# Patient Record
Sex: Female | Born: 1980 | Race: Black or African American | Hispanic: No | Marital: Single | Smoking: Former smoker
Health system: Southern US, Community
[De-identification: ages and names within clinical notes are randomized; demographics above are authoritative.]

## PROBLEM LIST (undated history)

## (undated) ENCOUNTER — Inpatient Hospital Stay (HOSPITAL_COMMUNITY): Payer: Self-pay

## (undated) DIAGNOSIS — J45909 Unspecified asthma, uncomplicated: Secondary | ICD-10-CM

---

## 2002-02-21 ENCOUNTER — Emergency Department (HOSPITAL_COMMUNITY): Admission: EM | Admit: 2002-02-21 | Discharge: 2002-02-22 | Payer: Self-pay | Admitting: *Deleted

## 2003-05-02 ENCOUNTER — Emergency Department (HOSPITAL_COMMUNITY): Admission: EM | Admit: 2003-05-02 | Discharge: 2003-05-02 | Payer: Self-pay | Admitting: Emergency Medicine

## 2004-08-06 ENCOUNTER — Emergency Department (HOSPITAL_COMMUNITY): Admission: EM | Admit: 2004-08-06 | Discharge: 2004-08-06 | Payer: Self-pay | Admitting: Emergency Medicine

## 2004-11-01 ENCOUNTER — Emergency Department (HOSPITAL_COMMUNITY): Admission: EM | Admit: 2004-11-01 | Discharge: 2004-11-01 | Payer: Self-pay | Admitting: Emergency Medicine

## 2005-03-30 ENCOUNTER — Other Ambulatory Visit: Admission: RE | Admit: 2005-03-30 | Discharge: 2005-03-30 | Payer: Self-pay | Admitting: Obstetrics and Gynecology

## 2005-06-28 ENCOUNTER — Inpatient Hospital Stay (HOSPITAL_COMMUNITY): Admission: AD | Admit: 2005-06-28 | Discharge: 2005-06-28 | Payer: Self-pay | Admitting: Obstetrics and Gynecology

## 2005-07-23 ENCOUNTER — Encounter (INDEPENDENT_AMBULATORY_CARE_PROVIDER_SITE_OTHER): Payer: Self-pay | Admitting: Specialist

## 2005-07-23 ENCOUNTER — Inpatient Hospital Stay (HOSPITAL_COMMUNITY): Admission: RE | Admit: 2005-07-23 | Discharge: 2005-07-24 | Payer: Self-pay | Admitting: Obstetrics and Gynecology

## 2005-07-23 ENCOUNTER — Ambulatory Visit: Payer: Self-pay | Admitting: Neonatology

## 2005-11-06 ENCOUNTER — Emergency Department (HOSPITAL_COMMUNITY): Admission: EM | Admit: 2005-11-06 | Discharge: 2005-11-06 | Payer: Self-pay | Admitting: Urology

## 2006-10-30 ENCOUNTER — Emergency Department (HOSPITAL_COMMUNITY): Admission: EM | Admit: 2006-10-30 | Discharge: 2006-10-30 | Payer: Self-pay | Admitting: Psychology

## 2007-06-21 ENCOUNTER — Emergency Department (HOSPITAL_COMMUNITY): Admission: EM | Admit: 2007-06-21 | Discharge: 2007-06-21 | Payer: Self-pay | Admitting: Emergency Medicine

## 2007-07-06 ENCOUNTER — Emergency Department (HOSPITAL_COMMUNITY): Admission: EM | Admit: 2007-07-06 | Discharge: 2007-07-06 | Payer: Self-pay | Admitting: Family Medicine

## 2008-06-10 ENCOUNTER — Emergency Department (HOSPITAL_COMMUNITY): Admission: EM | Admit: 2008-06-10 | Discharge: 2008-06-10 | Payer: Self-pay | Admitting: Family Medicine

## 2008-08-01 ENCOUNTER — Emergency Department (HOSPITAL_COMMUNITY): Admission: EM | Admit: 2008-08-01 | Discharge: 2008-08-01 | Payer: Self-pay | Admitting: Emergency Medicine

## 2008-11-27 ENCOUNTER — Emergency Department (HOSPITAL_COMMUNITY): Admission: EM | Admit: 2008-11-27 | Discharge: 2008-11-27 | Payer: Self-pay | Admitting: Emergency Medicine

## 2009-05-13 ENCOUNTER — Ambulatory Visit: Payer: Self-pay | Admitting: Obstetrics & Gynecology

## 2009-05-13 ENCOUNTER — Encounter: Payer: Self-pay | Admitting: Obstetrics & Gynecology

## 2009-05-13 ENCOUNTER — Inpatient Hospital Stay (HOSPITAL_COMMUNITY): Admission: AD | Admit: 2009-05-13 | Discharge: 2009-05-15 | Payer: Self-pay | Admitting: Obstetrics & Gynecology

## 2009-06-19 ENCOUNTER — Ambulatory Visit: Payer: Self-pay | Admitting: Obstetrics and Gynecology

## 2010-03-25 LAB — DIFFERENTIAL
Lymphocytes Relative: 33 % (ref 12–46)
Lymphs Abs: 2.6 10*3/uL (ref 0.7–4.0)
Neutro Abs: 4.5 10*3/uL (ref 1.7–7.7)
Neutrophils Relative %: 56 % (ref 43–77)

## 2010-03-25 LAB — CBC
HCT: 27.8 % — ABNORMAL LOW (ref 36.0–46.0)
Hemoglobin: 10.6 g/dL — ABNORMAL LOW (ref 12.0–15.0)
MCHC: 34.6 g/dL (ref 30.0–36.0)
MCHC: 34.6 g/dL (ref 30.0–36.0)
MCV: 92.5 fL (ref 78.0–100.0)
Platelets: 225 10*3/uL (ref 150–400)
RBC: 3 MIL/uL — ABNORMAL LOW (ref 3.87–5.11)
RDW: 13 % (ref 11.5–15.5)
WBC: 8 10*3/uL (ref 4.0–10.5)

## 2010-03-25 LAB — COMPREHENSIVE METABOLIC PANEL
ALT: 13 U/L (ref 0–35)
AST: 19 U/L (ref 0–37)
Albumin: 3.3 g/dL — ABNORMAL LOW (ref 3.5–5.2)
GFR calc non Af Amer: >60 mL/min (ref >60)
Glucose, Bld: 111 mg/dL — ABNORMAL HIGH (ref 70–99)
Sodium: 133 mEq/L — ABNORMAL LOW (ref 135–145)

## 2010-05-23 NOTE — Discharge Summary (Signed)
Kristen Casey, Kristen Casey                ACCOUNT NO.:  [id number]   MEDICAL RECORD NO.:  [id number]          PATIENT TYPE:  INP   LOCATION:  9304                          FACILITY:  [facility]   PHYSICIAN:  James A. Ashley Royalty, M.D.DATE OF BIRTH:  18-Feb-1980   DATE OF ADMISSION:  07/23/2005  DATE OF DISCHARGE:  07/24/2005                                 DISCHARGE SUMMARY   DISCHARGE DIAGNOSES:  1. Intrauterine pregnancy at [redacted] weeks gestation, delivered.  2. 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  3. Major depressive disorder with recommendation from [facility] in [facility] for termination of pregnancy for maternal      indication.   OPERATIONS AND SPECIAL PROCEDURES:  Therapeutic abortion via Cytotec  induction.   CONSULTATIONS:  Dr. Rennis Golden, neonatology.   DISCHARGE MEDICATIONS:  Motrin 600 mg.   HISTORY AND PHYSICAL:  This is a 30 year old gravida 5, para 1, EDC November 26, 2005, currently [redacted] weeks gestation.  The patient's pregnancy was  complicated by asthma and tobacco use.  Screening ultrasound was performed  June 05, 2005.  The ultrasound revealed an abnormality of the urethral valves  as well as oligohydramnios.  Subsequent consultation at [facility]  per [name] revealed a lethal condition of the fetus due to  nonfunctional urinary tract, as well as pulmonary hyperplasia.  The patient  was subsequently referred to [facility] for  depressive symptoms.  She was subsequently diagnosed as having a major  depressive disorder.  It was specifically recommended by her mental health  providers that she undergo termination of pregnancy for the preservation of  her own health.  Hence, she was admitted for Cytotec induction, labor in  order to accomplish a therapeutic abortion for this lethal anomaly.  For  remainder of the history and physical, please see  chart.   HOSPITAL COURSE:  The patient was admitted to [facility].  Admission laboratory studies were drawn.  Cytotec was initiated and the  neonatology consultation obtained.  On July 23, 2005, at 1:30 p.m., there  was birth of a 2-g female infant, Apgars 2 at one minute, 1 at five minutes,  1 at ten minutes.  The delivery was accomplished by Dr. Sylvester Harder from  a frank breech presentation.  There were no complications of delivery per  se.  The patient initially expressed an interest in an autopsy and  subsequently stated that she no longer desired an autopsy.  On July 24, 2005, she was felt to be stable for discharge and was discharged home  afebrile and in satisfactory condition.   DISPOSITION:  The patient is to return to [facility]  in approximately four weeks for followup.      James A. Ashley Royalty, M.D.  Electronically Signed     JAM/MEDQ  D:  09/10/2005  T:  09/10/2005  Job:  [id number]

## 2010-05-23 NOTE — H&P (Signed)
Kristen Casey, FURNARI NO.:  [id number]   MEDICAL RECORD NO.:  [id number]          PATIENT TYPE:  INP   LOCATION:                                FACILITY:  [facility]   PHYSICIAN:  James A. Ashley Royalty, M.D.DATE OF BIRTH:  1980-06-23   DATE OF ADMISSION:  07/23/2005  DATE OF DISCHARGE:                                HISTORY & PHYSICAL   This is a 30 year old gravida 5, para 1, EDC November 06, 2005, currently  approximately [redacted] weeks gestation.  The patient's pregnancy has been  complicated early by asthma and tobacco use.  She stopped smoking in mid  March.  The patient returned for screening ultrasound on June 05, 2005.  The  ultrasound revealed an abnormality of the urethral valves as well as  oligohydramnios.  The patient was subsequently sent to [facility] for  consultation and ultrasonographic assessment.  After being evaluated by Dr.  Particia Nearing, Dr. Sherrie George called me and informed me that [name] fetus  had a lethal condition due to a nonfunctioning urinary tract as well as  pulmonary hypoplasia.   The patient was subsequently referred to [facility] for depressive symptoms.  She has since been diagnosed as having  a major depressive disorder.  It has been specifically recommended by her  mental health providers that she undergo termination of pregnancy for the  preservation of her own health and that is the purpose of this  hospitalization.  The patient is hence for Cytotec induction of labor.  Please see associated supporting documents.   MEDICATIONS:  Vitamins.   PAST MEDICAL HISTORY:  As above.   SURGICAL HISTORY:  Negative.   ALLERGIES:  NONE.   FAMILY HISTORY:  Noncontributory.   SOCIAL HISTORY:  See above.  No significant use of alcohol.   REVIEW OF SYMPTOMS:  Noncontributory.   PHYSICAL EXAMINATION:  GENERAL:  Well-developed well-nourished pleasant  black female in no acute distress.  VITAL SIGNS:  Afebrile.   Vital signs stable.  CHEST:  Lungs are clear.  CARDIAC:  Regular rate and rhythm.  ABDOMEN:  Gravid with a fundal height of approximately 24 cm.  Fetal heart  tones auscultated.  PELVIC EXAMINATION:  (Please see most recent office exam).  Deferred until  admission.   IMPRESSION:  1.  Intrauterine pregnancy at [redacted] weeks gestation.  2.  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 Particia Nearing at [facility].  3.  Major depressive disorder with recommendation from [facility] in [facility] for termination of pregnancy - maternal      indication.   PLAN:  Therapeutic abortion via Cytotec induction labor.  Risks, benefits,  complications and alternatives fully discussed with the patient.  She states  she understands and accepts and desires the termination of labor.  I have  also informed the neonatal intensive care nursery, Dr. Rennis Golden.  He too  states he understands and accepts and is comfortable with this plan.  James A. Ashley Royalty, M.D.  Electronically Signed     JAM/MEDQ  D:  07/22/2005  T:  07/22/2005  Job:  [id number]

## 2010-10-02 LAB — CBC
Hemoglobin: 12.9
MCHC: 33.5
Platelets: 289
RDW: 13.3

## 2010-10-02 LAB — WET PREP, GENITAL
Trich, Wet Prep: NONE SEEN
Yeast Wet Prep HPF POC: NONE SEEN

## 2010-10-02 LAB — POCT URINALYSIS DIP (DEVICE)
Glucose, UA: NEGATIVE
Nitrite: NEGATIVE
Operator id: 247071
Protein, ur: NEGATIVE
Urobilinogen, UA: 0.2

## 2010-10-02 LAB — PREGNANCY, URINE: Preg Test, Ur: NEGATIVE

## 2010-10-02 LAB — POCT I-STAT, CHEM 8
BUN: 17
Chloride: 109
Glucose, Bld: 91
Hemoglobin: 13.9
TCO2: 25

## 2010-10-02 LAB — POCT PREGNANCY, URINE: Preg Test, Ur: NEGATIVE

## 2010-10-02 LAB — URINALYSIS, ROUTINE W REFLEX MICROSCOPIC
Glucose, UA: NEGATIVE
Ketones, ur: NEGATIVE
Nitrite: NEGATIVE

## 2010-10-02 LAB — DIFFERENTIAL
Basophils Absolute: 0.1
Lymphocytes Relative: 43
Lymphs Abs: 2.4
Monocytes Absolute: 0.5

## 2010-10-02 LAB — URINE MICROSCOPIC-ADD ON

## 2010-10-02 LAB — GC/CHLAMYDIA PROBE AMP, GENITAL: GC Probe Amp, Genital: NEGATIVE

## 2010-10-15 LAB — HERPES SIMPLEX VIRUS CULTURE: Culture: NOT DETECTED

## 2011-08-07 IMAGING — US US OB TRANSVAGINAL MODIFY
1 series · 14 of 28 positions shown · non-contrast
Comparison: none

CLINICAL DATA: Abdominal pain.  Ectopic pregnancy.

OBSTETRIC <14 WK US AND TRANSVAGINAL OB US
TECHNIQUE: Both transabdominal and transvaginal ultrasound
examinations were performed for complete evaluation of the
gestation as well as the maternal uterus, adnexal regions, and
pelvic cul-de-sac.

[Series 1: us pelvis complete modify · 0.25mm/px · 65 acquisitions, 14 frames shown]
[im 3/65]
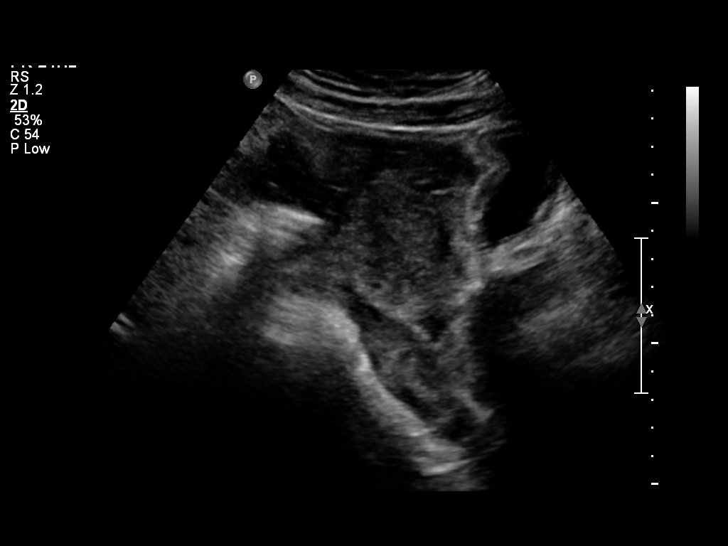
[im 8/65]
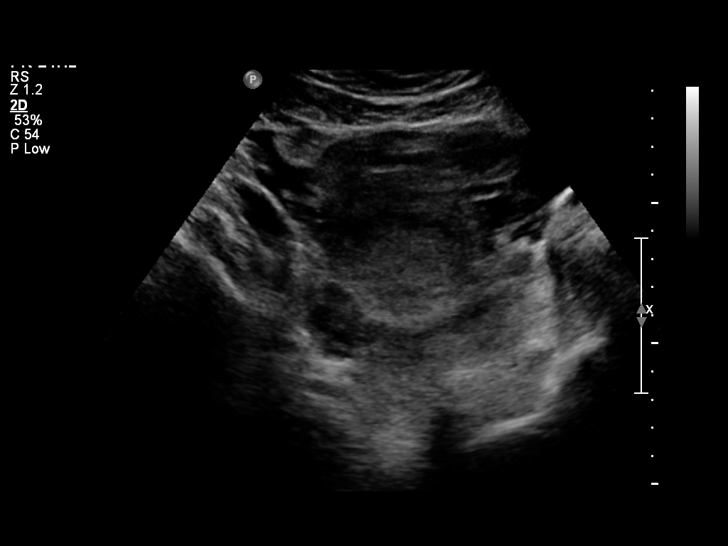
[im 12/65]
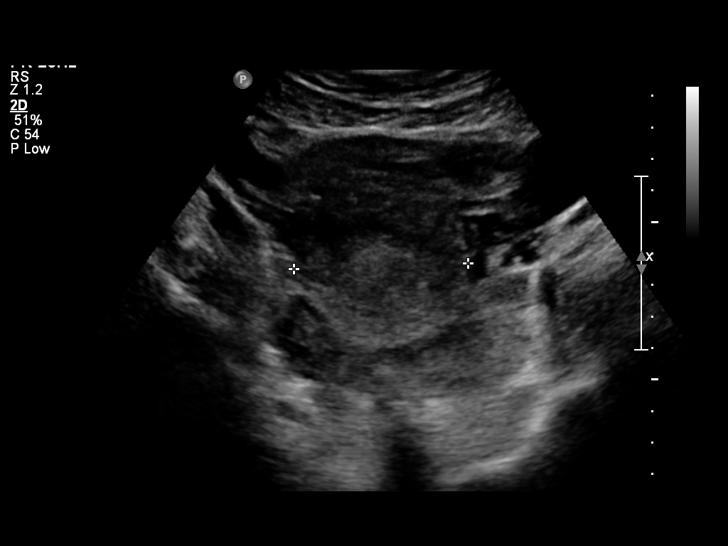
[im 17/65]
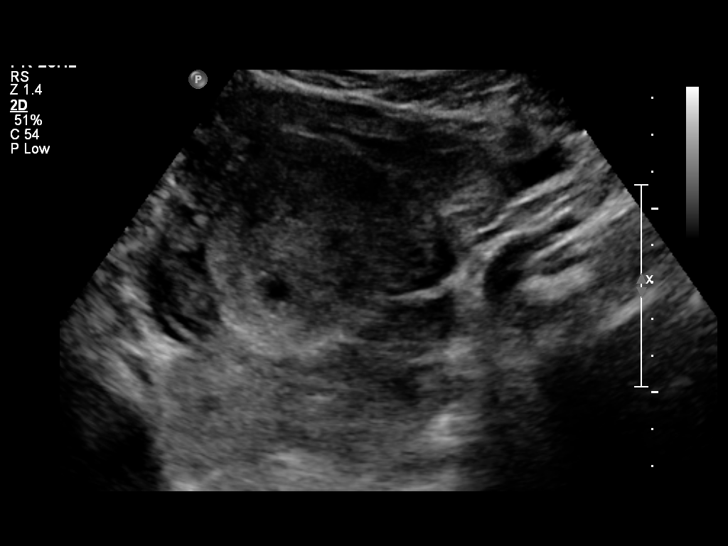
[im 22/65]
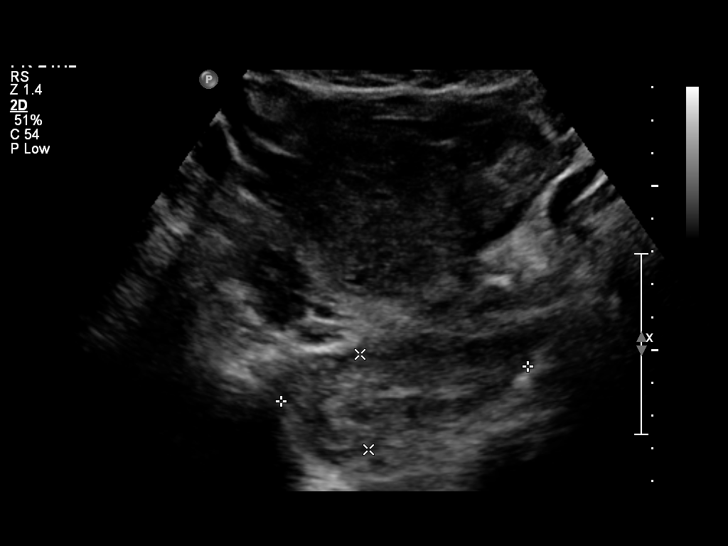
[im 27/65]
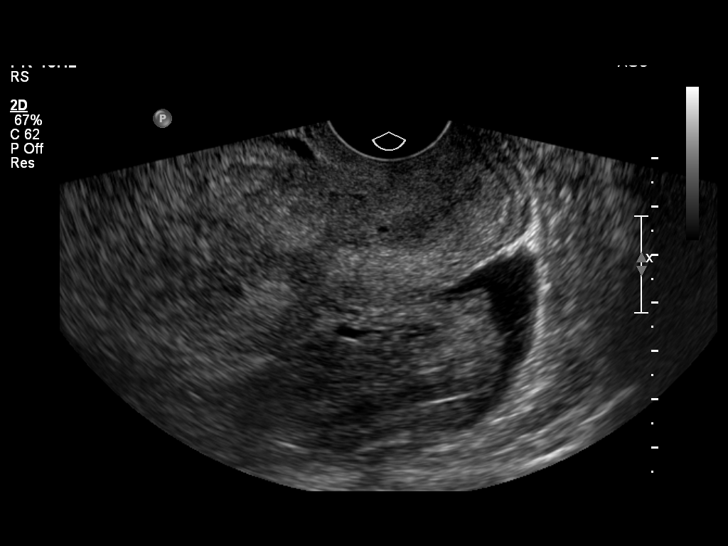
[im 31/65]
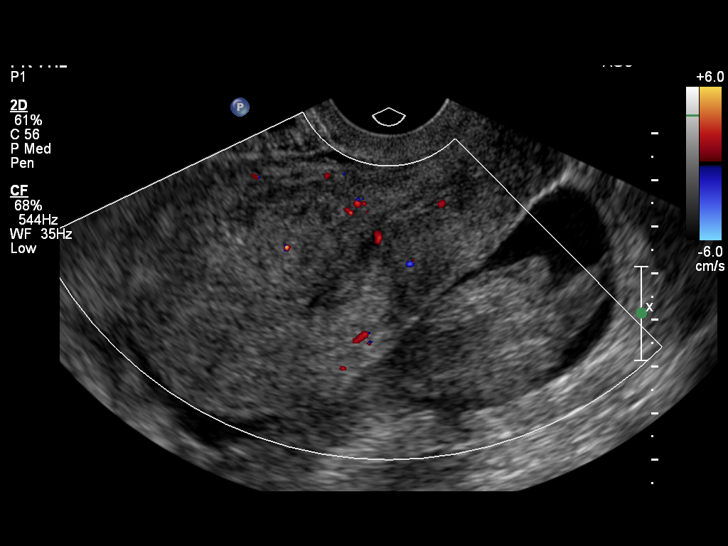
[im 36/65]
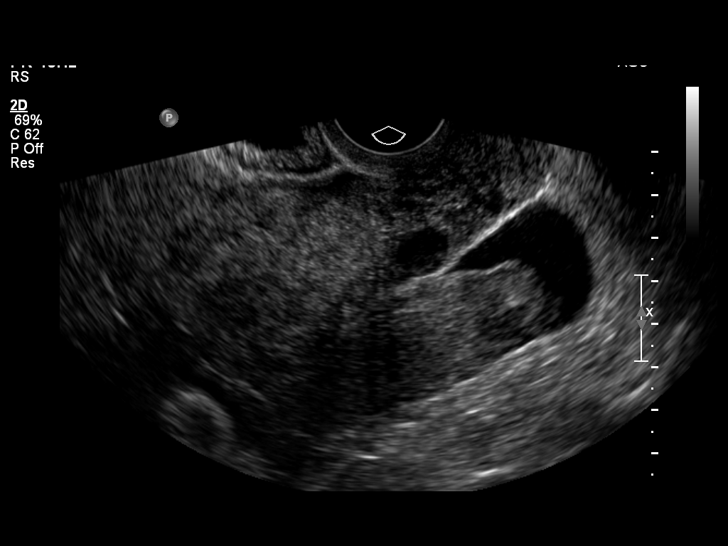
[im 41/65]
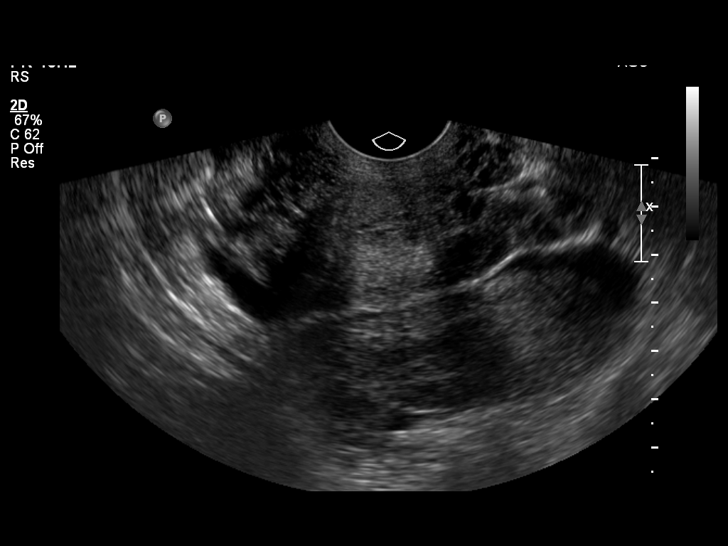
[im 46/65]
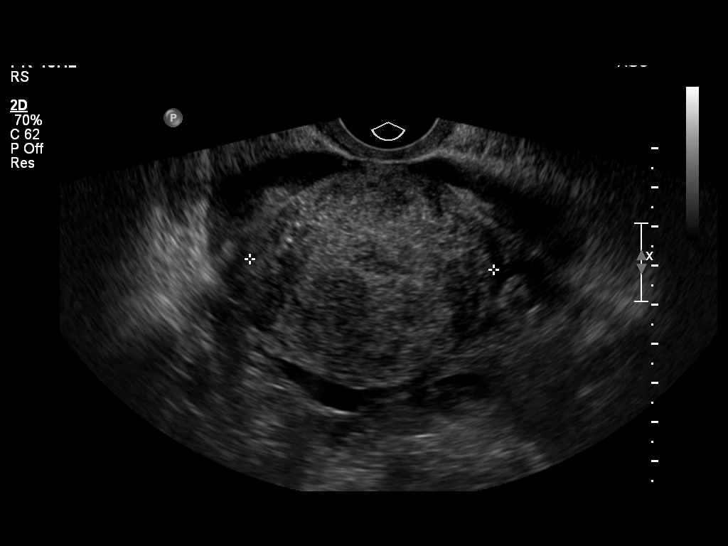
[im 50/65]
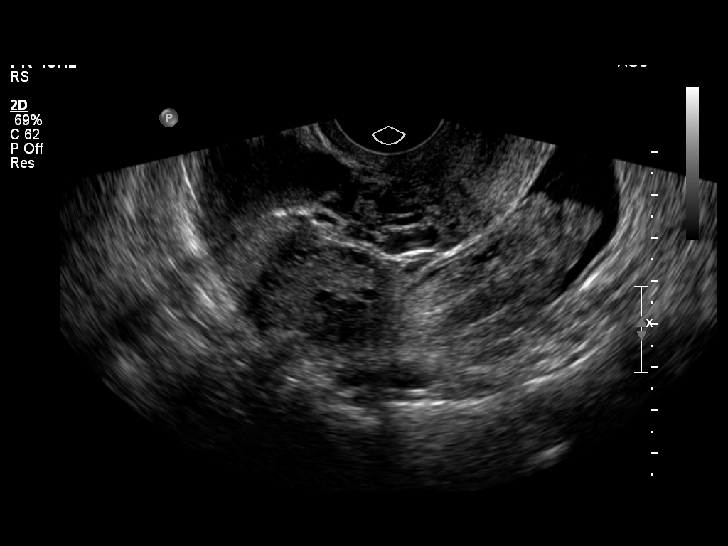
[im 55/65]
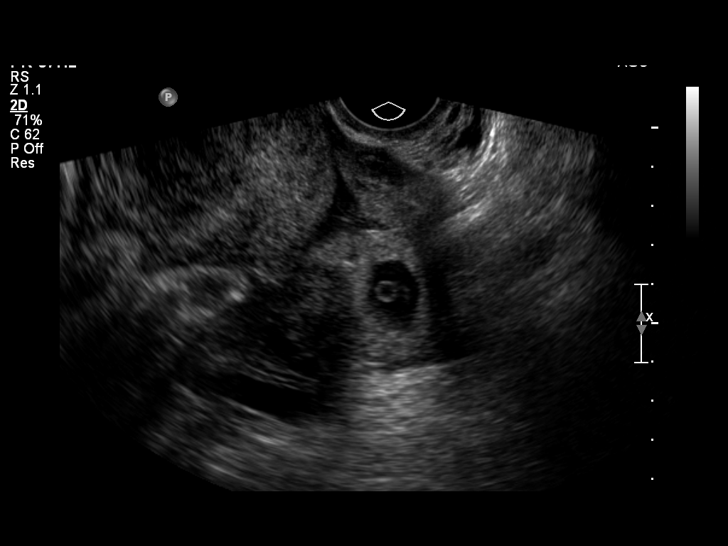
[im 60/65]
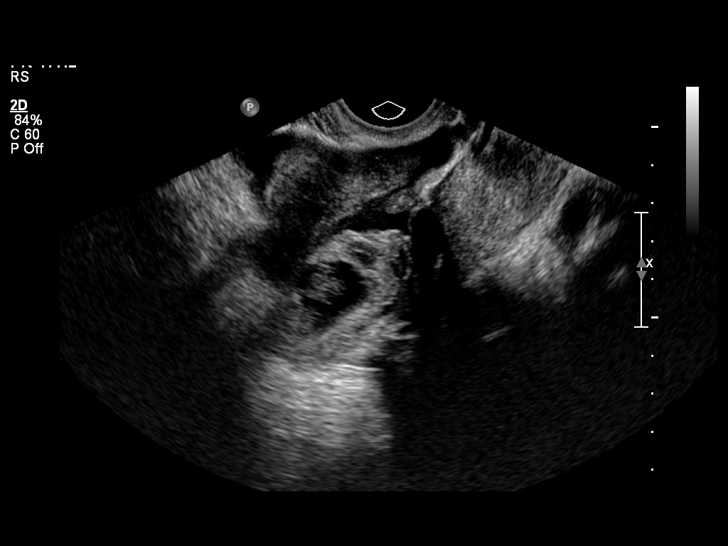
[im 65/65]
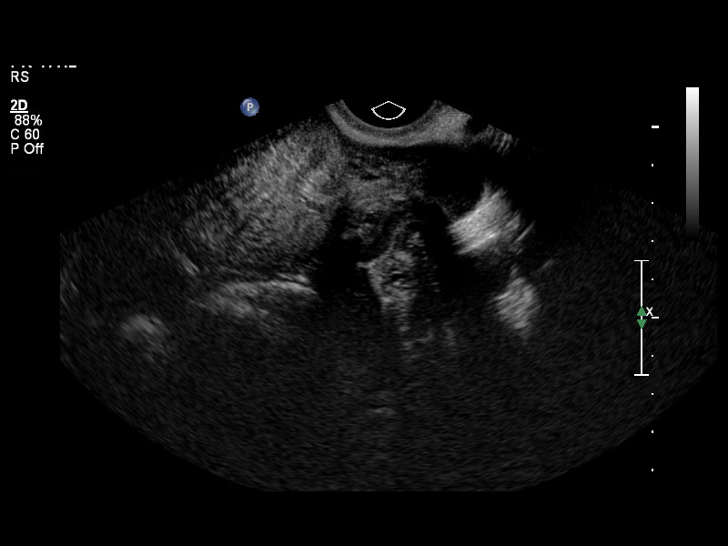

[14 of 28 positions shown; findings below may reference images not displayed]

FINDINGS: Large amount of hemoperitoneum is present.  In the left adnexa,
there is a gestational sac identified with a living fetus.  Fetal
heart tones are 144 beats per minute.  The peripheral flow is
present in the adnexa.  No intrauterine gestational sac is present.

Critical test results telephoned to NP Nakia at the time of
IMPRESSION: Ruptured left adnexal ectopic pregnancy with large hemoperitoneum.

## 2011-10-28 ENCOUNTER — Emergency Department (HOSPITAL_COMMUNITY): Payer: Self-pay

## 2011-10-28 ENCOUNTER — Emergency Department (HOSPITAL_COMMUNITY)
Admission: EM | Admit: 2011-10-28 | Discharge: 2011-10-28 | Disposition: A | Discharge status: Home / Self Care | Payer: Self-pay | Attending: Emergency Medicine | Admitting: Emergency Medicine

## 2011-10-28 ENCOUNTER — Encounter (HOSPITAL_COMMUNITY): Payer: Self-pay | Admitting: *Deleted

## 2011-10-28 DIAGNOSIS — N926 Irregular menstruation, unspecified: Secondary | ICD-10-CM

## 2011-10-28 DIAGNOSIS — N898 Other specified noninflammatory disorders of vagina: Secondary | ICD-10-CM | POA: Insufficient documentation

## 2011-10-28 DIAGNOSIS — N946 Dysmenorrhea, unspecified: Secondary | ICD-10-CM | POA: Insufficient documentation

## 2011-10-28 DIAGNOSIS — R110 Nausea: Secondary | ICD-10-CM | POA: Insufficient documentation

## 2011-10-28 DIAGNOSIS — N939 Abnormal uterine and vaginal bleeding, unspecified: Secondary | ICD-10-CM

## 2011-10-28 DIAGNOSIS — Z79899 Other long term (current) drug therapy: Secondary | ICD-10-CM | POA: Insufficient documentation

## 2011-10-28 LAB — URINALYSIS, ROUTINE W REFLEX MICROSCOPIC
Glucose, UA: NEGATIVE mg/dL
Leukocytes, UA: NEGATIVE
Protein, ur: NEGATIVE mg/dL
Urobilinogen, UA: 0.2 mg/dL (ref 0.0–1.0)

## 2011-10-28 LAB — BASIC METABOLIC PANEL
Calcium: 9.3 mg/dL (ref 8.4–10.5)
Creatinine, Ser: 0.78 mg/dL (ref 0.50–1.10)
GFR calc non Af Amer: >90 mL/min (ref >90)
Sodium: 138 mEq/L (ref 135–145)

## 2011-10-28 LAB — CBC WITH DIFFERENTIAL/PLATELET
Basophils Absolute: 0 10*3/uL (ref 0.0–0.1)
Basophils Relative: 1 % (ref 0–1)
Eosinophils Absolute: 0.1 10*3/uL (ref 0.0–0.7)
Eosinophils Relative: 1 % (ref 0–5)
HCT: 36.4 % (ref 36.0–46.0)
MCH: 28.1 pg (ref 26.0–34.0)
MCHC: 32.4 g/dL (ref 30.0–36.0)
MCV: 86.7 fL (ref 78.0–100.0)
Monocytes Absolute: 0.7 10*3/uL (ref 0.1–1.0)
Platelets: 404 10*3/uL — ABNORMAL HIGH (ref 150–400)
RDW: 13 % (ref 11.5–15.5)
WBC: 5.6 10*3/uL (ref 4.0–10.5)

## 2011-10-28 LAB — POCT PREGNANCY, URINE: Preg Test, Ur: NEGATIVE

## 2011-10-28 LAB — URINE MICROSCOPIC-ADD ON

## 2011-10-28 LAB — WET PREP, GENITAL
Clue Cells Wet Prep HPF POC: NONE SEEN
WBC, Wet Prep HPF POC: NONE SEEN

## 2011-10-28 MED ORDER — MORPHINE SULFATE 4 MG/ML IJ SOLN
4.0000 mg | Freq: Once | INTRAMUSCULAR | Status: AC
  Administered 2011-10-28: 4 mg via INTRAVENOUS
  Filled 2011-10-28: qty 1

## 2011-10-28 MED ORDER — ONDANSETRON HCL 4 MG/2ML IJ SOLN
4.0000 mg | Freq: Once | INTRAMUSCULAR | Status: AC
  Administered 2011-10-28: 4 mg via INTRAVENOUS
  Filled 2011-10-28: qty 2

## 2011-10-28 MED ORDER — SODIUM CHLORIDE 0.9 % IV BOLUS (SEPSIS)
1000.0000 mL | Freq: Once | INTRAVENOUS | Status: AC
  Administered 2011-10-28: 1000 mL via INTRAVENOUS

## 2011-10-28 MED ORDER — POTASSIUM CHLORIDE CRYS ER 20 MEQ PO TBCR
40.0000 meq | EXTENDED_RELEASE_TABLET | Freq: Once | ORAL | Status: AC
  Administered 2011-10-28: 40 meq via ORAL
  Filled 2011-10-28: qty 2

## 2011-10-28 NOTE — ED Notes (Signed)
Preg. Negative.

## 2011-10-28 NOTE — ED Notes (Signed)
Pt had very little output of urine

## 2011-10-28 NOTE — ED Notes (Signed)
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

## 2011-10-28 NOTE — ED Provider Notes (Signed)
History     CSN: [id number]  Arrival date & time 10/28/11  1431   First MD Initiated Contact with Patient 10/28/11 1515      Chief Complaint  Patient presents with  . Abdominal Pain  . Vaginal Bleeding    (Consider location/radiation/quality/duration/timing/severity/associated sxs/prior treatment) The history is provided by the patient, medical records and a significant other.    Kristen Casey is a 31 y.o. female presents to the emergency department complaining of abdominal pain.  The onset of the symptoms was  abrupt starting 2 hours ago.  The patient has associated nausea, vaginal bleeding.  The symptoms have been  persistent, gradually worsened.  nothing makes the symptoms worse and nothing makes symptoms better.  The patient denies fever, chills, headache, neck pain, chest pain, shortness of breath, vomiting, diarrhea, constipation.  Pt G4, P1021.  Denies Hx of STI.  Pt with Hx of ectopic pregnancy in Jan 2012 with L salpingectomy.   LMP 1 mo ago and pt began menstrual cycle yesterday.  Acute onset of suprapubic cramping pain and passing of large vaginal clots this afternoon.  Pt is unsure if she is pregnant.    History reviewed. No pertinent past medical history.  History reviewed. No pertinent past surgical history.  History reviewed. No pertinent family history.  History  Substance Use Topics  . Smoking status: Not on file  . Smokeless tobacco: Not on file  . Alcohol Use: Yes    OB History    Grav Para Term Preterm Abortions TAB SAB Ect Mult Living                  Review of Systems  Constitutional: Negative for fever, diaphoresis, appetite change, fatigue and unexpected weight change.  HENT: Negative for mouth sores, trouble swallowing, neck pain and neck stiffness.   Respiratory: Negative for cough, chest tightness, shortness of breath, wheezing and stridor.   Cardiovascular: Negative for chest pain and palpitations.  Gastrointestinal: Positive for nausea and  abdominal pain. Negative for vomiting, diarrhea, constipation, blood in stool, abdominal distention and rectal pain.  Genitourinary: Positive for vaginal bleeding and menstrual problem. Negative for dysuria, urgency, frequency, hematuria, flank pain and difficulty urinating.  Musculoskeletal: Negative for back pain.  Skin: Negative for rash.  Neurological: Negative for weakness.  Hematological: Negative for adenopathy.  Psychiatric/Behavioral: Negative for confusion.  All other systems reviewed and are negative.    Allergies  Review of patient's allergies indicates no known allergies.  Home Medications   Current Outpatient Rx  Name Route Sig Dispense Refill  . ACETAMINOPHEN 500 MG PO TABS Oral Take 500 mg by mouth every 6 (six) hours as needed. Pain      BP 113/55  Pulse 74  Temp 98.5 F (36.9 C) (Oral)  Resp 12  Ht 5\' 4"  (1.626 m)  Wt 185 lb (83.915 kg)  BMI 31.76 kg/m2  SpO2 99%  LMP 10/28/2011  Physical Exam  Nursing note and vitals reviewed. Constitutional: She appears well-developed and well-nourished. No distress.  HENT:  Head: Normocephalic and atraumatic.  Mouth/Throat: Oropharynx is clear and moist. No oropharyngeal exudate.  Eyes: Conjunctivae normal are normal. No scleral icterus.  Neck: Normal range of motion. Neck supple.  Cardiovascular: Normal rate, regular rhythm, normal heart sounds and intact distal pulses.  Exam reveals no gallop and no friction rub.   No murmur heard. Pulmonary/Chest: Effort normal and breath sounds normal. No respiratory distress. She has no wheezes. She has no rales. She exhibits no  tenderness.  Abdominal: Soft. Bowel sounds are normal. She exhibits no mass. There is tenderness (LLQ, suprapubic). There is guarding. There is no rebound.  Genitourinary: Pelvic exam was performed with patient supine. No labial fusion. There is no rash, tenderness, lesion or injury on the right labia. There is no rash, tenderness, lesion or injury on the  left labia. Uterus is tender. Uterus is not deviated, not enlarged and not fixed. Cervix exhibits no motion tenderness, no discharge and no friability. Right adnexum displays no mass, no tenderness and no fullness. Left adnexum displays no mass, no tenderness and no fullness. There is bleeding around the vagina. No erythema or tenderness around the vagina. No foreign body around the vagina. No signs of injury around the vagina. No vaginal discharge found.    Musculoskeletal: Normal range of motion. She exhibits no edema.  Lymphadenopathy:    She has no cervical adenopathy.  Neurological: She is alert. She exhibits normal muscle tone. Coordination normal.       Speech is clear and goal oriented Moves extremities without ataxia  Skin: Skin is warm and dry. No rash noted. She is not diaphoretic. No erythema.  Psychiatric: She has a normal mood and affect.    ED Course  Procedures (including critical care time)  Labs Reviewed  CBC WITH DIFFERENTIAL - Abnormal; Notable for the following:    Hemoglobin 11.8 (*)     Platelets 404 (*)     All other components within normal limits  BASIC METABOLIC PANEL - Abnormal; Notable for the following:    Potassium 3.3 (*)     Glucose, Bld 143 (*)     All other components within normal limits  URINALYSIS, ROUTINE W REFLEX MICROSCOPIC - Abnormal; Notable for the following:    APPearance CLOUDY (*)     Specific Gravity, Urine 1.036 (*)     Hgb urine dipstick LARGE (*)     Ketones, ur TRACE (*)     All other components within normal limits  URINE MICROSCOPIC-ADD ON - Abnormal; Notable for the following:    Bacteria, UA FEW (*)     Casts HYALINE CASTS (*)     All other components within normal limits  POCT PREGNANCY, URINE  WET PREP, GENITAL  HCG, QUANTITATIVE, PREGNANCY  GC/CHLAMYDIA PROBE AMP, GENITAL   US Transvaginal Non-ob  10/28/2011  *RADIOLOGY REPORT*  Clinical Data:  Left lower quadrant pain, bleeding, past history of ectopic pregnancy  with left salpingectomy; quantitative beta HCG < 1  TRANSABDOMINAL AND TRANSVAGINAL ULTRASOUND OF PELVIS DOPPLER ULTRASOUND OF OVARIES  Technique:  Both transabdominal and transvaginal ultrasound examinations of the pelvis were performed. Transabdominal technique was performed for global imaging of the pelvis including uterus, ovaries, adnexal regions, and pelvic cul-de-sac.  It was necessary to proceed with endovaginal exam following the transabdominal exam to visualize the endometrial complex and adnexae.  Color and duplex Doppler ultrasound was utilized to evaluate blood flow to the ovaries.  Comparison:  None  Findings:  Uterus:  9.7 cm length by 4.9 cm AP by 5.6 cm transverse.  Normal morphology without mass.  Endometrium:  9 mm thick, normal.  No endometrial fluid.  Right ovary: 4.0 x 1.9 x 2.1 cm.  Normal morphology without mass. Internal blood flow present on color Doppler imaging.  Left ovary:   3.5 x 2.2 x 3.0 cm.  Normal morphology without mass. Internal blood flow present on color Doppler imaging.  Pulsed Doppler evaluation demonstrates normal low-resistance arterial and venous waveforms in  both ovaries.  Trace free pelvic fluid.  IMPRESSION: Normal exam. No evidence of pelvic mass or other significant abnormality. No sonographic evidence for ovarian torsion.   Original Report Authenticated By: Lollie Marrow, M.D.    US Pelvis Complete  10/28/2011  *RADIOLOGY REPORT*  Clinical Data:  Left lower quadrant pain, bleeding, past history of ectopic pregnancy with left salpingectomy; quantitative beta HCG < 1  TRANSABDOMINAL AND TRANSVAGINAL ULTRASOUND OF PELVIS DOPPLER ULTRASOUND OF OVARIES  Technique:  Both transabdominal and transvaginal ultrasound examinations of the pelvis were performed. Transabdominal technique was performed for global imaging of the pelvis including uterus, ovaries, adnexal regions, and pelvic cul-de-sac.  It was necessary to proceed with endovaginal exam following the  transabdominal exam to visualize the endometrial complex and adnexae.  Color and duplex Doppler ultrasound was utilized to evaluate blood flow to the ovaries.  Comparison:  None  Findings:  Uterus:  9.7 cm length by 4.9 cm AP by 5.6 cm transverse.  Normal morphology without mass.  Endometrium:  9 mm thick, normal.  No endometrial fluid.  Right ovary: 4.0 x 1.9 x 2.1 cm.  Normal morphology without mass. Internal blood flow present on color Doppler imaging.  Left ovary:   3.5 x 2.2 x 3.0 cm.  Normal morphology without mass. Internal blood flow present on color Doppler imaging.  Pulsed Doppler evaluation demonstrates normal low-resistance arterial and venous waveforms in both ovaries.  Trace free pelvic fluid.  IMPRESSION: Normal exam. No evidence of pelvic mass or other significant abnormality. No sonographic evidence for ovarian torsion.   Original Report Authenticated By: Lollie Marrow, M.D.    [place] Art/ven Flow Abd Pelv Doppler  10/28/2011  *RADIOLOGY REPORT*  Clinical Data:  Left lower quadrant pain, bleeding, past history of ectopic pregnancy with left salpingectomy; quantitative beta HCG < 1  TRANSABDOMINAL AND TRANSVAGINAL ULTRASOUND OF PELVIS DOPPLER ULTRASOUND OF OVARIES  Technique:  Both transabdominal and transvaginal ultrasound examinations of the pelvis were performed. Transabdominal technique was performed for global imaging of the pelvis including uterus, ovaries, adnexal regions, and pelvic cul-de-sac.  It was necessary to proceed with endovaginal exam following the transabdominal exam to visualize the endometrial complex and adnexae.  Color and duplex Doppler ultrasound was utilized to evaluate blood flow to the ovaries.  Comparison:  None  Findings:  Uterus:  9.7 cm length by 4.9 cm AP by 5.6 cm transverse.  Normal morphology without mass.  Endometrium:  9 mm thick, normal.  No endometrial fluid.  Right ovary: 4.0 x 1.9 x 2.1 cm.  Normal morphology without mass. Internal blood flow present on color  Doppler imaging.  Left ovary:   3.5 x 2.2 x 3.0 cm.  Normal morphology without mass. Internal blood flow present on color Doppler imaging.  Pulsed Doppler evaluation demonstrates normal low-resistance arterial and venous waveforms in both ovaries.  Trace free pelvic fluid.  IMPRESSION: Normal exam. No evidence of pelvic mass or other significant abnormality. No sonographic evidence for ovarian torsion.   Original Report Authenticated By: Lollie Marrow, M.D.    Results for orders placed during the hospital encounter of 10/28/11  CBC WITH DIFFERENTIAL      Component Value Range   WBC 5.6  4.0 - 10.5 K/uL   RBC 4.20  3.87 - 5.11 MIL/uL   Hemoglobin 11.8 (*) 12.0 - 15.0 g/dL   HCT 45.4  09.8 - 11.9 %   MCV 86.7  78.0 - 100.0 fL   MCH 28.1  26.0 -  34.0 pg   MCHC 32.4  30.0 - 36.0 g/dL   RDW 16.1  09.6 - 04.5 %   Platelets 404 (*) 150 - 400 K/uL   Neutrophils Relative 44  43 - 77 %   Neutro Abs 2.5  1.7 - 7.7 K/uL   Lymphocytes Relative 42  12 - 46 %   Lymphs Abs 2.3  0.7 - 4.0 K/uL   Monocytes Relative 12  3 - 12 %   Monocytes Absolute 0.7  0.1 - 1.0 K/uL   Eosinophils Relative 1  0 - 5 %   Eosinophils Absolute 0.1  0.0 - 0.7 K/uL   Basophils Relative 1  0 - 1 %   Basophils Absolute 0.0  0.0 - 0.1 K/uL  BASIC METABOLIC PANEL      Component Value Range   Sodium 138  135 - 145 mEq/L   Potassium 3.3 (*) 3.5 - 5.1 mEq/L   Chloride 103  96 - 112 mEq/L   CO2 22  19 - 32 mEq/L   Glucose, Bld 143 (*) 70 - 99 mg/dL   BUN 9  6 - 23 mg/dL   Creatinine, Ser 4.09  0.50 - 1.10 mg/dL   Calcium 9.3  8.4 - 81.1 mg/dL   GFR calc non Af Amer >90  >90 mL/min   GFR calc Af Amer >90  >90 mL/min  URINALYSIS, ROUTINE W REFLEX MICROSCOPIC      Component Value Range   Color, Urine YELLOW  YELLOW   APPearance CLOUDY (*) CLEAR   Specific Gravity, Urine 1.036 (*) 1.005 - 1.030   pH 5.5  5.0 - 8.0   Glucose, UA NEGATIVE  NEGATIVE mg/dL   Hgb urine dipstick LARGE (*) NEGATIVE   Bilirubin Urine NEGATIVE   NEGATIVE   Ketones, ur TRACE (*) NEGATIVE mg/dL   Protein, ur NEGATIVE  NEGATIVE mg/dL   Urobilinogen, UA 0.2  0.0 - 1.0 mg/dL   Nitrite NEGATIVE  NEGATIVE   Leukocytes, UA NEGATIVE  NEGATIVE  POCT PREGNANCY, URINE      Component Value Range   Preg Test, Ur NEGATIVE  NEGATIVE  WET PREP, GENITAL      Component Value Range   Yeast Wet Prep HPF POC NONE SEEN  NONE SEEN   Trich, Wet Prep NONE SEEN  NONE SEEN   Clue Cells Wet Prep HPF POC NONE SEEN  NONE SEEN   WBC, Wet Prep HPF POC NONE SEEN  NONE SEEN  URINE MICROSCOPIC-ADD ON      Component Value Range   Squamous Epithelial / LPF RARE  RARE   WBC, UA 0-2  <3 WBC/hpf   RBC / HPF 3-6  <3 RBC/hpf   Bacteria, UA FEW (*) RARE   Casts HYALINE CASTS (*) NEGATIVE   Urine-Other AMORPHOUS URATES/PHOSPHATES    HCG, QUANTITATIVE, PREGNANCY      Component Value Range   hCG, Beta Chain, Quant, S <1  <5 mIU/mL     1. Menstrual cramps   2. Menstrual bleeding problem   3. Menstrual periods, abnormal      MDM  Kristen Casey presents with abdominal pain and vaginal bleeding.  Concern for a miscarriage versus ectopic with patient's history.  Urine pregnancy and hCG negative, wet prep negative, urinalysis without evidence of urinary tract infection, CBC without leukocytosis BMP with mild hypokalemia which was replaced here in the emergency department.  Pelvic ultrasound without evidence of torsion, ectopic pregnancy or pelvic mass.  Patient given fluids, pain medication  and antiemetics with drastic improvement in symptoms. Patient is ambulatory without difficulty. She states that her pain has resolved.  Pain is likely secondary to her onset of menses. Patient is nontoxic, nonseptic appearing, in no apparent distress.  Patient's pain and other symptoms adequately managed in emergency department.  Fluid bolus given.  Labs, imaging and vitals reviewed.  Patient does not meet the SIRS or Sepsis criteria.  On repeat exam patient does not have a surgical  abdomin and there are nor peritoneal signs.  No indication of appendicitis, bowel obstruction, bowel perforation, cholecystitis, diverticulitis, PID or ectopic pregnancy. I have discussed all these findings with the patient. I also recommend followup with her gynecologist.  I have also discussed reasons to return immediately to the ER.  Patient expresses understanding and agrees with plan.  1. Medications: home medications 2. Treatment: Rest, drink plenty of fluids, use ibuprofen to treat pain, heating pad for cramps 3. Follow Up: With gynecology.         Dahlia Client Murel Shenberger, PA-C 10/28/11 2000

## 2011-10-28 NOTE — Progress Notes (Signed)
Copy of pt's medicaid card provided to ED registration for updating EPIC

## 2011-10-28 NOTE — Progress Notes (Signed)
[facility] ED noted no pcp Spoke with pt who showed Cm her Medicaid card indicating pcp as [facility] Pt Provided with a list of alternative medicaid providers and discussed her going to DSS or calling to get new medicaid provider.  [facility] closed on 09/04/11

## 2011-10-29 LAB — GC/CHLAMYDIA PROBE AMP, GENITAL: Chlamydia, DNA Probe: POSITIVE — AB

## 2011-10-30 NOTE — ED Provider Notes (Signed)
Medical screening examination/treatment/procedure(s) were performed by non-physician practitioner and as supervising physician I was immediately available for consultation/collaboration.   Jimmie Rueter L Yamira Papa, MD 10/30/11 1451 

## 2011-10-31 ENCOUNTER — Telehealth (HOSPITAL_COMMUNITY): Payer: Self-pay | Admitting: Emergency Medicine

## 2011-10-31 NOTE — ED Notes (Signed)
+  Chlamydia. Chart sent to EDP office for review. Prescribed Azithromycin 1 gram po x 1. Prescribed by Felicie Morn NP.

## 2011-11-01 ENCOUNTER — Telehealth (HOSPITAL_COMMUNITY): Payer: Self-pay | Admitting: Emergency Medicine

## 2012-10-13 ENCOUNTER — Ambulatory Visit: Payer: Self-pay

## 2012-10-17 ENCOUNTER — Encounter (HOSPITAL_COMMUNITY): Payer: Self-pay | Admitting: *Deleted

## 2012-10-17 ENCOUNTER — Inpatient Hospital Stay (HOSPITAL_COMMUNITY)
Admission: AD | Admit: 2012-10-17 | Discharge: 2012-10-17 | Disposition: A | Discharge status: Home / Self Care | Payer: Self-pay | Source: Ambulatory Visit | Attending: Obstetrics and Gynecology | Admitting: Obstetrics and Gynecology

## 2012-10-17 ENCOUNTER — Inpatient Hospital Stay (HOSPITAL_COMMUNITY): Payer: Self-pay

## 2012-10-17 DIAGNOSIS — N949 Unspecified condition associated with female genital organs and menstrual cycle: Secondary | ICD-10-CM | POA: Insufficient documentation

## 2012-10-17 DIAGNOSIS — O2390 Unspecified genitourinary tract infection in pregnancy, unspecified trimester: Secondary | ICD-10-CM | POA: Insufficient documentation

## 2012-10-17 DIAGNOSIS — B9689 Other specified bacterial agents as the cause of diseases classified elsewhere: Secondary | ICD-10-CM | POA: Insufficient documentation

## 2012-10-17 DIAGNOSIS — Z3490 Encounter for supervision of normal pregnancy, unspecified, unspecified trimester: Secondary | ICD-10-CM

## 2012-10-17 DIAGNOSIS — M545 Low back pain, unspecified: Secondary | ICD-10-CM | POA: Insufficient documentation

## 2012-10-17 DIAGNOSIS — N760 Acute vaginitis: Secondary | ICD-10-CM | POA: Insufficient documentation

## 2012-10-17 DIAGNOSIS — Z8759 Personal history of other complications of pregnancy, childbirth and the puerperium: Secondary | ICD-10-CM

## 2012-10-17 DIAGNOSIS — A499 Bacterial infection, unspecified: Secondary | ICD-10-CM | POA: Insufficient documentation

## 2012-10-17 LAB — URINALYSIS, ROUTINE W REFLEX MICROSCOPIC
Glucose, UA: NEGATIVE mg/dL
Hgb urine dipstick: NEGATIVE
Leukocytes, UA: NEGATIVE
pH: 6 (ref 5.0–8.0)

## 2012-10-17 LAB — WET PREP, GENITAL
Trich, Wet Prep: NONE SEEN
Yeast Wet Prep HPF POC: NONE SEEN

## 2012-10-17 LAB — POCT PREGNANCY, URINE: Preg Test, Ur: POSITIVE — AB

## 2012-10-17 LAB — CBC
HCT: 36 % (ref 36.0–46.0)
Hemoglobin: 11.5 g/dL — ABNORMAL LOW (ref 12.0–15.0)
RBC: 4.22 MIL/uL (ref 3.87–5.11)
WBC: 6.6 10*3/uL (ref 4.0–10.5)

## 2012-10-17 LAB — HCG, QUANTITATIVE, PREGNANCY: hCG, Beta Chain, Quant, S: 29399 m[IU]/mL — ABNORMAL HIGH (ref <5)

## 2012-10-17 MED ORDER — METRONIDAZOLE 500 MG PO TABS: 500.0000 mg | ORAL_TABLET | Freq: Two times a day (BID) | ORAL | Status: DC

## 2012-10-17 NOTE — MAU Provider Note (Signed)
History     CSN: [id number]  Arrival date and time: 10/17/12 1518   First Provider Initiated Contact with Patient 10/17/12 1731      Chief Complaint  Patient presents with  . Possible Pregnancy  . Abdominal Pain  . Vaginal Bleeding   HPI This is a 32 y.o. female at [redacted]w[redacted]d who presents with c/o pelvic pain and low back pain  Worried she is having an ectopic. Not happy about pregnancy.   RN note: +HPT 2 wks ago. Has been having strong abd pain (like a bad period), seeing stars, having pain in low back. Spotting noted when urinates. Hx of ectopic, felt a lot like this.      OB History   Grav Para Term Preterm Abortions TAB SAB Ect Mult Living   4 2 1 1 1   1  1       History reviewed. No pertinent past medical history.  No past surgical history on file.  No family history on file.  History  Substance Use Topics  . Smoking status: Not on file  . Smokeless tobacco: Not on file  . Alcohol Use: Yes    Allergies: No Known Allergies  Prescriptions prior to admission  Medication Sig Dispense Refill  . acetaminophen (TYLENOL) 500 MG tablet Take 500 mg by mouth every 6 (six) hours as needed. Pain      . diphenhydrAMINE-zinc acetate (BENADRYL) cream Apply 1 application topically 3 (three) times daily as needed for itching.        Review of Systems  Constitutional: Negative for fever, chills and malaise/fatigue.  Eyes: Positive for blurred vision (seeing stars at times).  Cardiovascular: Negative for chest pain.  Gastrointestinal: Positive for abdominal pain. Negative for nausea and vomiting.  Musculoskeletal: Positive for back pain.  Neurological: Negative for headaches.   Physical Exam   Blood pressure 116/64, pulse 71, temperature 98.6 F (37 C), temperature source Oral, resp. rate 18, height 5\' 5"  (1.651 m), weight 135.626 kg (299 lb), last menstrual period 08/24/2012.  Physical Exam  Constitutional: She is oriented to person, place, and time. She appears  well-developed and well-nourished. No distress.  HENT:  Head: Normocephalic.  Cardiovascular: Normal rate.   Respiratory: Effort normal.  GI: Soft. She exhibits no distension. There is tenderness (pelvis/suprapubic). There is no rebound and no guarding.  Genitourinary: Vagina normal and uterus normal. No vaginal discharge found.  Musculoskeletal: Normal range of motion.  Neurological: She is alert and oriented to person, place, and time.  Skin: Skin is warm and dry.  Psychiatric: She has a normal mood and affect.    MAU Course  Procedures  MDM Results for orders placed during the hospital encounter of 10/17/12 (from the past 72 hour(s))  URINALYSIS, ROUTINE W REFLEX MICROSCOPIC     Status: None   Collection Time    10/17/12  3:45 PM      Result Value Range   Color, Urine YELLOW  YELLOW   APPearance CLEAR  CLEAR   Specific Gravity, Urine 1.025  1.005 - 1.030   pH 6.0  5.0 - 8.0   Glucose, UA NEGATIVE  NEGATIVE mg/dL   Hgb urine dipstick NEGATIVE  NEGATIVE   Bilirubin Urine NEGATIVE  NEGATIVE   Ketones, ur NEGATIVE  NEGATIVE mg/dL   Protein, ur NEGATIVE  NEGATIVE mg/dL   Urobilinogen, UA 0.2  0.0 - 1.0 mg/dL   Nitrite NEGATIVE  NEGATIVE   Leukocytes, UA NEGATIVE  NEGATIVE   Comment: MICROSCOPIC NOT DONE ON  URINES WITH NEGATIVE PROTEIN, BLOOD, LEUKOCYTES, NITRITE, OR GLUCOSE <1000 mg/dL.  POCT PREGNANCY, URINE     Status: Abnormal   Collection Time    10/17/12  4:07 PM      Result Value Range   Preg Test, Ur POSITIVE (*) NEGATIVE   Comment:            THE SENSITIVITY OF THIS     METHODOLOGY IS >24 mIU/mL  HCG, QUANTITATIVE, PREGNANCY     Status: Abnormal   Collection Time    10/17/12  4:09 PM      Result Value Range   hCG, Beta Chain, Quant, S 60454 (*) <5 mIU/mL   Comment:              GEST. AGE      CONC.  (mIU/mL)       <=1 WEEK        5 - 50         2 WEEKS       50 - 500         3 WEEKS       100 - 10,000         4 WEEKS     1,000 - 30,000         5 WEEKS      3,500 - 115,000       6-8 WEEKS     12,000 - 270,000        12 WEEKS     15,000 - 220,000                FEMALE AND NON-PREGNANT FEMALE:         LESS THAN 5 mIU/mL  CBC     Status: Abnormal   Collection Time    10/17/12  4:17 PM      Result Value Range   WBC 6.6  4.0 - 10.5 K/uL   RBC 4.22  3.87 - 5.11 MIL/uL   Hemoglobin 11.5 (*) 12.0 - 15.0 g/dL   HCT 09.8  11.9 - 14.7 %   MCV 85.3  78.0 - 100.0 fL   MCH 27.3  26.0 - 34.0 pg   MCHC 31.9  30.0 - 36.0 g/dL   RDW 82.9  56.2 - 13.0 %   Platelets 339  150 - 400 K/uL  GC/CHLAMYDIA PROBE AMP     Status: None   Collection Time    10/17/12  5:40 PM      Result Value Range   CT Probe RNA NEGATIVE  NEGATIVE   GC Probe RNA NEGATIVE  NEGATIVE   Comment: (NOTE)                                                                                              Normal Reference Range: Negative          Assay performed using the Gen-Probe APTIMA COMBO2 (R) Assay.     Acceptable specimen types for this assay include APTIMA Swabs (Unisex,     endocervical, urethral, or vaginal), first void urine, and ThinPrep     liquid based cytology samples.  Performed at [facility]  WET PREP, GENITAL     Status: Abnormal   Collection Time    10/17/12  5:40 PM      Result Value Range   Yeast Wet Prep HPF POC NONE SEEN  NONE SEEN   Trich, Wet Prep NONE SEEN  NONE SEEN   Clue Cells Wet Prep HPF POC MODERATE (*) NONE SEEN   WBC, Wet Prep HPF POC FEW (*) NONE SEEN   Comment: MANY BACTERIA SEEN   US Ob Transvaginal  10/17/2012   CLINICAL DATA:  Right-sided pelvic pain. Gestational age of [redacted] weeks 5 days by LMP. Previous left salpingectomy for ectopic pregnancy.  EXAM: OBSTETRIC <14 WK ULTRASOUND  TECHNIQUE: Transabdominal ultrasound was performed for evaluation of the gestation as well as the maternal uterus and adnexal regions.  COMPARISON:  None.  FINDINGS: Intrauterine gestational sac: Visualized/normal in shape.  Yolk sac:  Visualized  Embryo:   Visualized  Cardiac Activity: Visualized  Heart Rate: 114 bpm  CRL:   4  mm   6 w 1 d                  [place] EDC: 06/11/2013  Maternal uterus/adnexae: No abnormality identified. Both ovaries are normal in appearance.    IMPRESSION: Single living IUP measuring 6 weeks 1 day with [place] EDC of 06/11/2013.  No significant maternal uterine or adnexal abnormality identified.     Electronically Signed   By: Myles Rosenthal M.D.   On: 10/17/2012 17:04    Assessment and Plan  A:  SIUP at   7.5 weeks       BV  P:  Discharge home       Rx Flagyl      Pt undecided about abortion vs keeping baby      List of providers given  [facility] 10/17/2012, 5:46 PM

## 2012-10-17 NOTE — MAU Note (Signed)
+  HPT 2 wks ago.  Has been having strong abd pain (like a bad period), seeing stars, having pain in low back.  Spotting noted when urinates. Hx of ectopic, felt a lot like this.

## 2012-10-18 LAB — GC/CHLAMYDIA PROBE AMP
CT Probe RNA: NEGATIVE
GC Probe RNA: NEGATIVE

## 2012-10-19 NOTE — MAU Provider Note (Signed)
Attestation of Attending Supervision of Advanced Practitioner (CNM/NP): Evaluation and management procedures were performed by the Advanced Practitioner under my supervision and collaboration.  I have reviewed the Advanced Practitioner's note and chart, and I agree with the management and plan.  Tawn Fitzner 10/19/2012 10:31 AM   

## 2013-08-22 ENCOUNTER — Encounter (HOSPITAL_COMMUNITY): Payer: Self-pay | Admitting: *Deleted

## 2013-11-06 ENCOUNTER — Encounter (HOSPITAL_COMMUNITY): Payer: Self-pay | Admitting: *Deleted

## 2014-01-21 IMAGING — US US TRANSVAGINAL NON-OB
1 series · 13 of 25 positions shown · non-contrast
Comparison: None

CLINICAL DATA: Left lower quadrant pain, bleeding, past history of
ectopic pregnancy with left salpingectomy; quantitative beta HCG <
1

TRANSABDOMINAL AND TRANSVAGINAL ULTRASOUND OF PELVIS
DOPPLER ULTRASOUND OF OVARIES
TECHNIQUE: Both transabdominal and transvaginal ultrasound
examinations of the pelvis were performed. Transabdominal technique
was performed for global imaging of the pelvis including uterus,
ovaries, adnexal regions, and pelvic cul-de-sac.
It was necessary to proceed with endovaginal exam following the
transabdominal exam to visualize the endometrial complex and
adnexae.
Color and duplex Doppler ultrasound was utilized to evaluate blood
flow to the ovaries.

[Series 1: us transvaginal non-ob · 0.30mm/px · 13 of 56 slices shown]
[im 1/56]
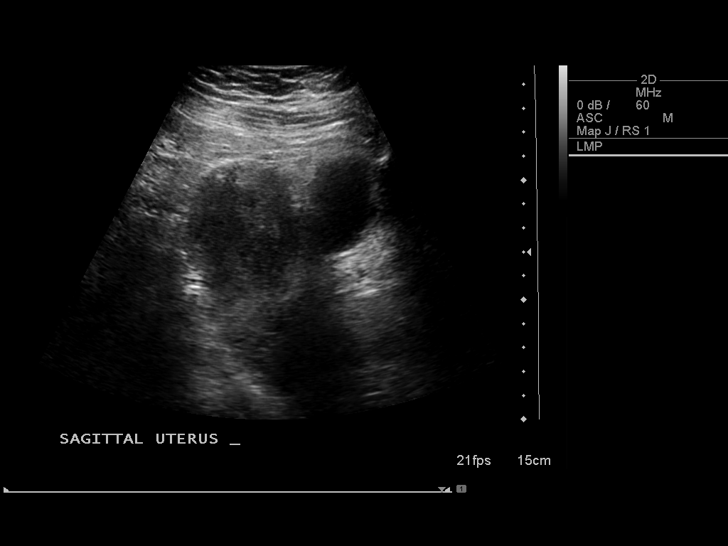
[im 5/56]
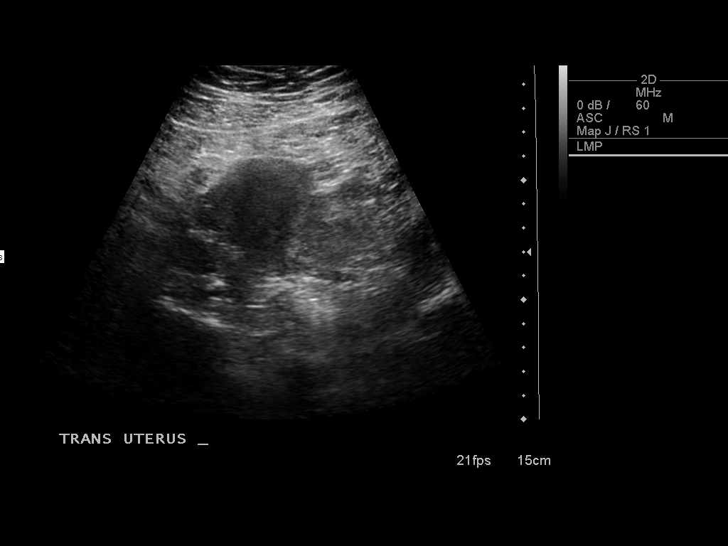
[im 10/56]
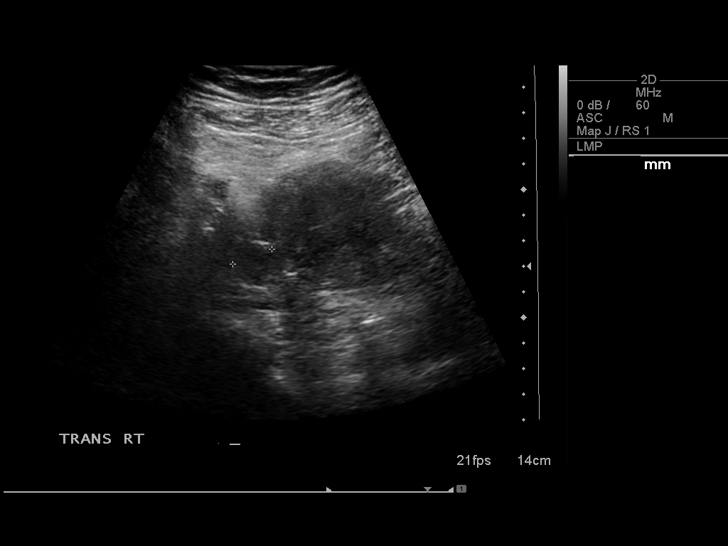
[im 14/56]
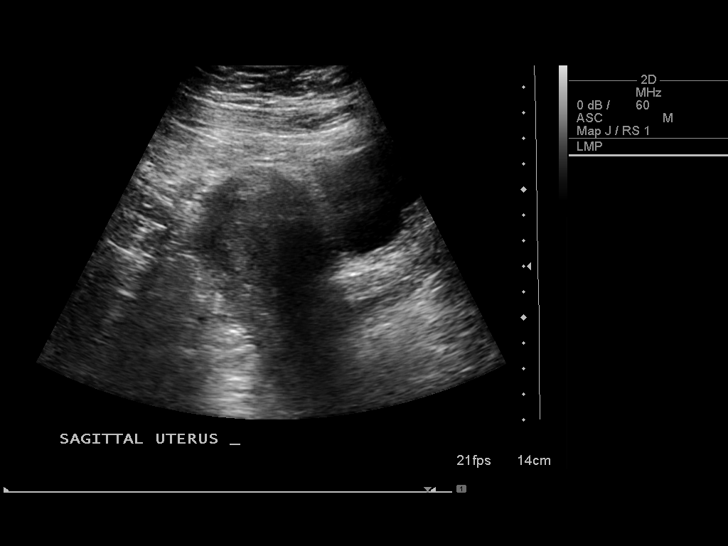
[im 19/56]
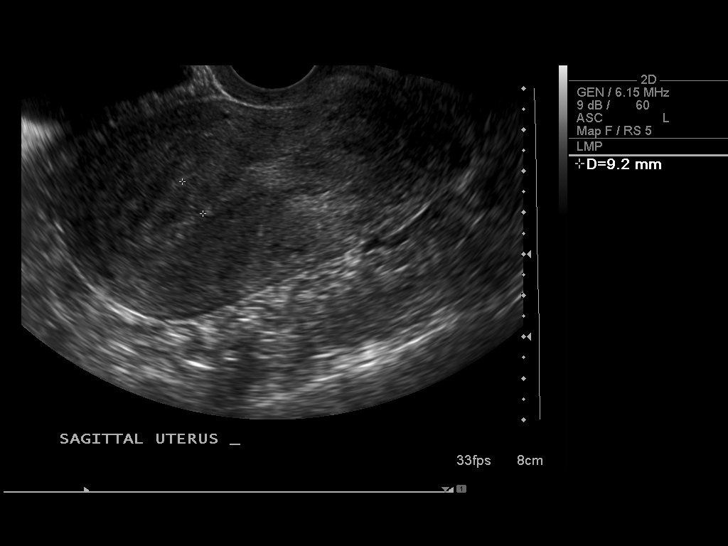
[im 23/56]
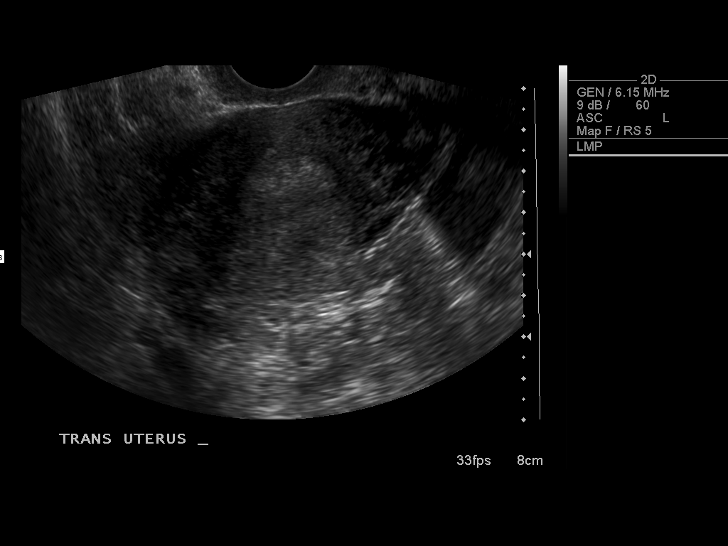
[im 28/56]
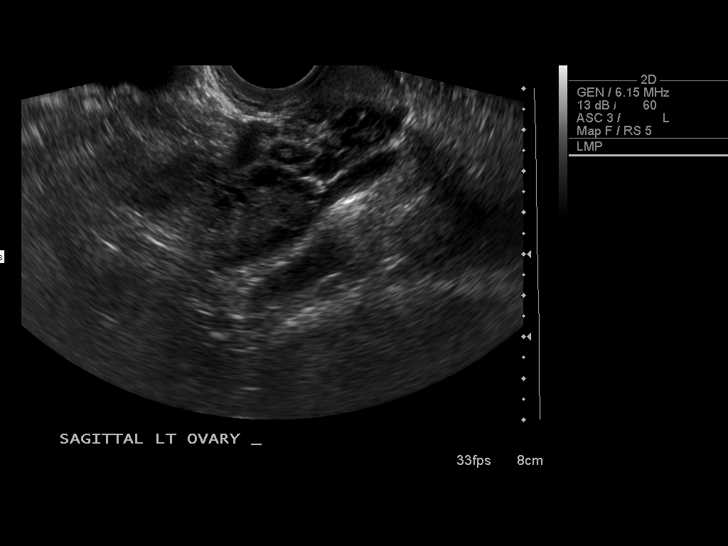
[im 33/56]
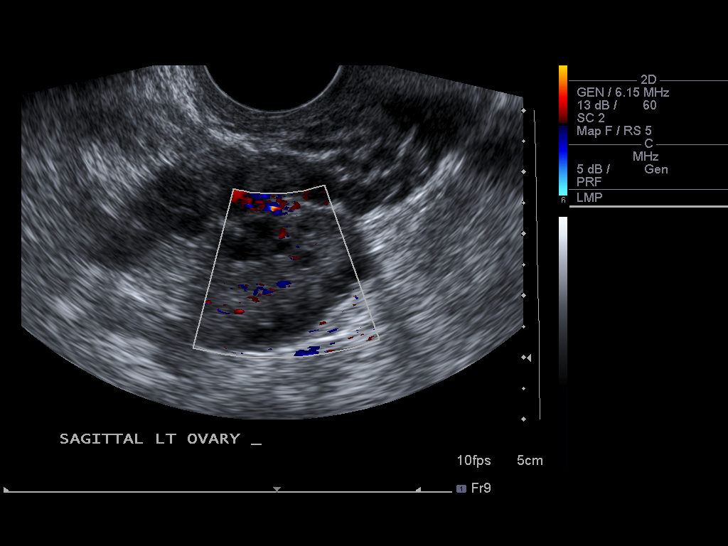
[im 37/56]
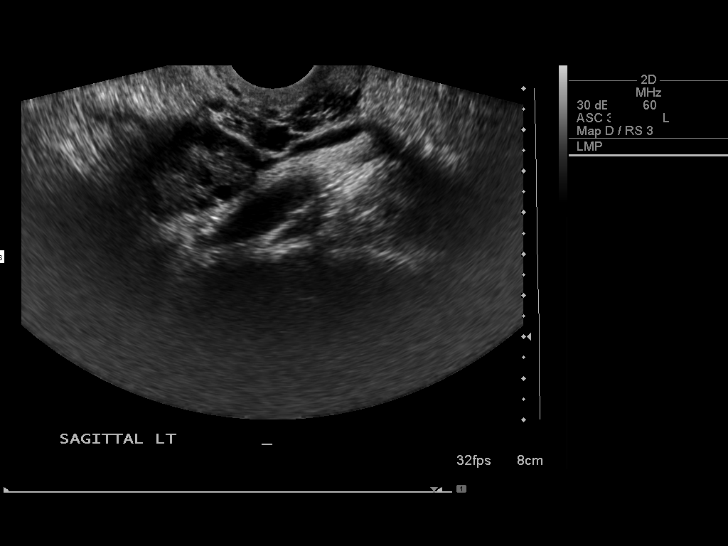
[im 42/56]
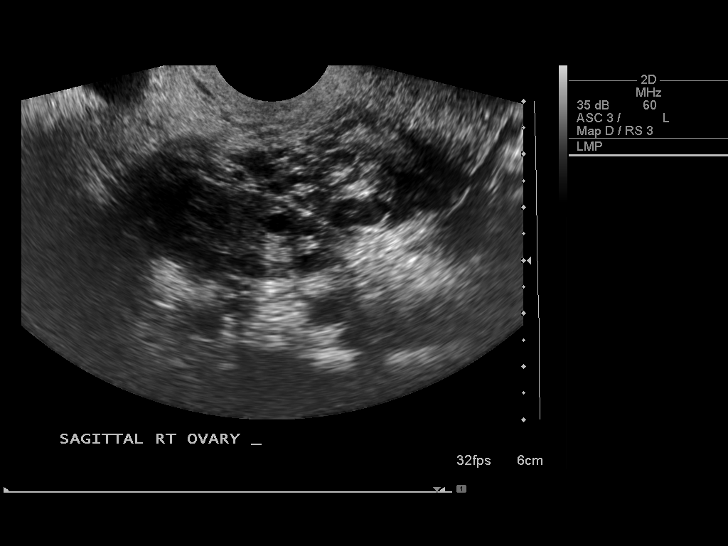
[im 46/56]
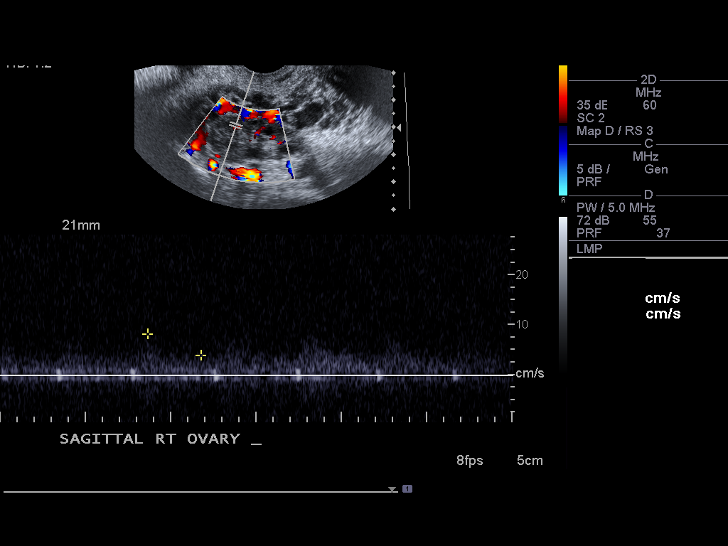
[im 51/56]
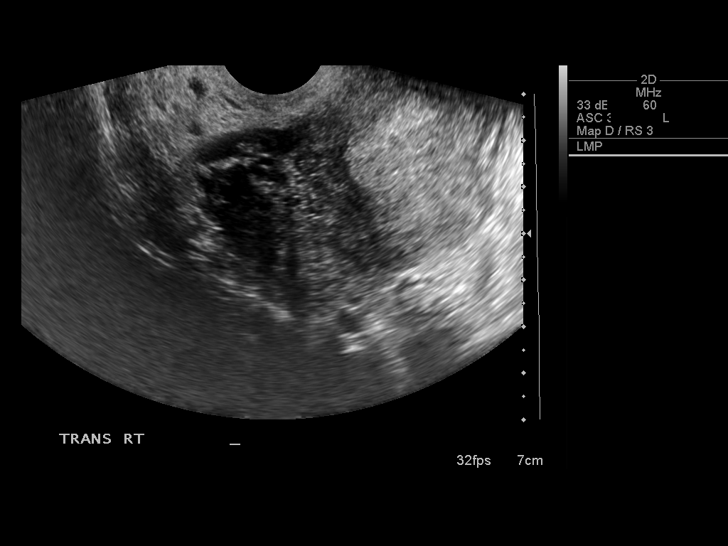
[im 56/56]
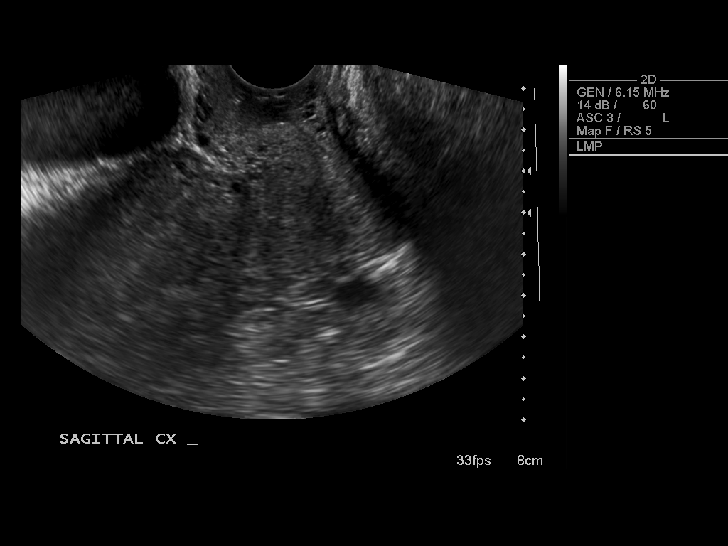

[13 of 25 positions shown; findings below may reference images not displayed]

FINDINGS: Uterus:  9.7 cm length by 4.9 cm AP by 5.6 cm transverse.  Normal
morphology without mass.

Endometrium:  9 mm thick, normal.  No endometrial fluid.

Right ovary: 4.0 x 1.9 x 2.1 cm.  Normal morphology without mass.
Internal blood flow present on color Doppler imaging.

Left ovary:   3.5 x 2.2 x 3.0 cm.  Normal morphology without mass.
Internal blood flow present on color Doppler imaging.

Pulsed Doppler evaluation demonstrates normal low-resistance
arterial and venous waveforms in both ovaries.

Trace free pelvic fluid.
IMPRESSION: Normal exam.
No evidence of pelvic mass or other significant abnormality.
No sonographic evidence for ovarian torsion.

## 2016-08-28 ENCOUNTER — Emergency Department (HOSPITAL_COMMUNITY)
Admission: EM | Admit: 2016-08-28 | Discharge: 2016-08-28 | Disposition: A | Discharge status: Home / Self Care | Payer: No Typology Code available for payment source | Attending: Emergency Medicine | Admitting: Emergency Medicine

## 2016-08-28 ENCOUNTER — Encounter (HOSPITAL_COMMUNITY): Payer: Self-pay | Admitting: *Deleted

## 2016-08-28 DIAGNOSIS — M549 Dorsalgia, unspecified: Secondary | ICD-10-CM | POA: Diagnosis not present
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
## 2017-02-15 ENCOUNTER — Emergency Department (HOSPITAL_COMMUNITY)
Admission: EM | Admit: 2017-02-15 | Discharge: 2017-02-15 | Disposition: A | Discharge status: Home / Self Care | Payer: Self-pay | Attending: Emergency Medicine | Admitting: Emergency Medicine

## 2017-02-15 ENCOUNTER — Encounter (HOSPITAL_COMMUNITY): Payer: Self-pay

## 2017-02-15 DIAGNOSIS — Y999 Unspecified external cause status: Secondary | ICD-10-CM | POA: Insufficient documentation

## 2017-02-15 DIAGNOSIS — Y929 Unspecified place or not applicable: Secondary | ICD-10-CM | POA: Insufficient documentation

## 2017-02-15 DIAGNOSIS — Z87891 Personal history of nicotine dependence: Secondary | ICD-10-CM | POA: Insufficient documentation

## 2017-02-15 DIAGNOSIS — X58XXXA Exposure to other specified factors, initial encounter: Secondary | ICD-10-CM | POA: Insufficient documentation

## 2017-02-15 DIAGNOSIS — S025XXA Fracture of tooth (traumatic), initial encounter for closed fracture: Secondary | ICD-10-CM | POA: Insufficient documentation

## 2017-02-15 DIAGNOSIS — Y939 Activity, unspecified: Secondary | ICD-10-CM | POA: Insufficient documentation

## 2017-02-15 MED ORDER — AMOXICILLIN 500 MG PO CAPS: 500.0000 mg | ORAL_CAPSULE | Freq: Three times a day (TID) | ORAL | 0 refills | Status: DC

## 2017-02-15 MED ORDER — IBUPROFEN 800 MG PO TABS: 800.0000 mg | ORAL_TABLET | Freq: Three times a day (TID) | ORAL | 0 refills | Status: AC | PRN

## 2017-02-15 MED ORDER — OXYCODONE-ACETAMINOPHEN 5-325 MG PO TABS
1.0000 | ORAL_TABLET | Freq: Once | ORAL | Status: AC
  Administered 2017-02-15: 1 via ORAL
  Filled 2017-02-15: qty 1

## 2017-02-15 NOTE — ED Notes (Signed)
Patient has a visibly cracked tooth on the left side but complains of pain all over her jaw and mouth. Patient has trouble speaking because of the pain and discomfort. She states it all started yesterday.

## 2017-02-15 NOTE — ED Provider Notes (Signed)
[facility]-EMERGENCY DEPT Provider Note   CSN: [id number] Arrival date & time: 02/15/17  1907     History   Chief Complaint Chief Complaint  Patient presents with  . Dental Pain    HPI Kristen Casey is a 37 y.o. female.  The history is provided by the patient. No language interpreter was used.  Dental Pain     Kristen Casey is a 37 y.o. female who presents to the Emergency Department complaining of dental pain.  She has pain over her left upper molar that began today.  Yesterday she was eating and felt her tooth crumble.  She did not have immediate pain but today the pain became severe and constant.  She feels like there is some drainage down her throat and feels like there might be infection.  She also has pain in her right ear.  No fevers, nausea, vomiting, difficulty breathing.  She has contacted a dentist but does not have an appointment for 2 weeks. History reviewed. No pertinent past medical history.  Patient Active Problem List   Diagnosis Date Noted  . Hx of ectopic pregnancy 10/17/2012    History reviewed. No pertinent surgical history.  OB History    Gravida Para Term Preterm AB Living   4 2 1 1 1 1    SAB TAB Ectopic Multiple Live Births       1   2       Home Medications    Prior to Admission medications   Medication Sig Start Date End Date Taking? Authorizing Provider  acetaminophen (TYLENOL) 500 MG tablet Take 500 mg by mouth every 6 (six) hours as needed. Pain    [provider]  amoxicillin (AMOXIL) 500 MG capsule Take 1 capsule (500 mg total) by mouth 3 (three) times daily. 02/15/17   Tilden Fossa, MD  diphenhydrAMINE-zinc acetate (BENADRYL) cream Apply 1 application topically 3 (three) times daily as needed for itching.    [provider]  ibuprofen (ADVIL,MOTRIN) 800 MG tablet Take 1 tablet (800 mg total) by mouth every 8 (eight) hours as needed. 02/15/17   Tilden Fossa, MD  metroNIDAZOLE (FLAGYL) 500 MG  tablet Take 1 tablet (500 mg total) by mouth 2 (two) times daily. 10/17/12   Aviva Signs, CNM    Family History History reviewed. No pertinent family history.  Social History Social History   Tobacco Use  . Smoking status: Former Smoker    Last attempt to quit: 08/07/2016    Years since quitting: 0.5  . Smokeless tobacco: Never Used  Substance Use Topics  . Alcohol use: Yes  . Drug use: No     Allergies   Patient has no known allergies.   Review of Systems Review of Systems  All other systems reviewed and are negative.    Physical Exam Updated Vital Signs BP 136/90 (BP Location: Right Arm)   Pulse 85   Temp 98.3 F (36.8 C) (Oral)   Resp 16   Ht 5\' 6"  (1.676 m)   Wt 79.4 kg (175 lb)   LMP 02/05/2017   SpO2 98%   BMI 28.25 kg/m   Physical Exam  Constitutional: She is oriented to person, place, and time. She appears well-developed and well-nourished.  HENT:  Head: Normocephalic and atraumatic.  Right Ear: External ear normal.  Left Ear: External ear normal.  Mouth/Throat: Oropharynx is clear and moist.    Eyes: EOM are normal. Pupils are equal, round, and reactive to light.  Neck: Neck supple.  Cardiovascular: Normal rate and regular rhythm.  No murmur heard. Pulmonary/Chest: Effort normal and breath sounds normal. No respiratory distress.  Abdominal: There is no guarding.  Neurological: She is alert and oriented to person, place, and time.  Skin: Skin is warm and dry. Capillary refill takes less than 2 seconds.  Psychiatric: She has a normal mood and affect. Her behavior is normal.  Nursing note and vitals reviewed.     ED Treatments / Results  Labs (all labs ordered are listed, but only abnormal results are displayed) Labs Reviewed - No data to display  EKG  EKG Interpretation None       Radiology No results found.  Procedures Procedures (including critical care time) Dental paste: Attempted to place dental cement to cover exposed  pulp.  The area was dried with gauze and nasal cannula oxygen.  Dental cement was then mixed and applied over the exposed pulp and again nasal cannula oxygen was applied over the area for drying.  Patient tolerated the procedure well.  No immediate complications. Medications Ordered in ED Medications  oxyCODONE-acetaminophen (PERCOCET/ROXICET) 5-325 MG per tablet 1 tablet (1 tablet Oral Given 02/15/17 2312)     Initial Impression / Assessment and Plan / ED Course  I have reviewed the triage vital signs and the nursing notes.  Pertinent labs & imaging results that were available during my care of the patient were reviewed by me and considered in my medical decision making (see chart for details).     Patient here for evaluation of dental pain, fractured a tooth yesterday.  She does have exposed pulp on examination, no evidence of acute abscess at this time.  Attempted to apply dental paste for symptomatic treatment.  Patient with no significant change in symptoms following application.  Will treat with antibiotics for possible developing infection.  Discussed with patient importance of dentistry follow-up for further treatment.  Final Clinical Impressions(s) / ED Diagnoses   Final diagnoses:  Closed fracture of tooth, initial encounter    ED Discharge Orders        Ordered    amoxicillin (AMOXIL) 500 MG capsule  3 times daily     02/15/17 2244    ibuprofen (ADVIL,MOTRIN) 800 MG tablet  Every 8 hours PRN     02/15/17 2244       Tilden Fossaees, Ercell Razon, MD 02/16/17 [phone]

## 2017-02-15 NOTE — ED Triage Notes (Signed)
Pt complains of dental pain possibly an abcess

## 2019-09-25 ENCOUNTER — Encounter (HOSPITAL_COMMUNITY): Payer: Self-pay

## 2019-09-25 ENCOUNTER — Ambulatory Visit (HOSPITAL_COMMUNITY)
Admission: EM | Admit: 2019-09-25 | Discharge: 2019-09-25 | Disposition: A | Discharge status: Home / Self Care | Payer: 59 | Attending: Internal Medicine | Admitting: Internal Medicine

## 2019-09-25 ENCOUNTER — Other Ambulatory Visit: Payer: Self-pay

## 2019-09-25 DIAGNOSIS — N760 Acute vaginitis: Secondary | ICD-10-CM

## 2019-09-25 MED ORDER — LIDOCAINE 5 % EX OINT: 1.0000 "application " | TOPICAL_OINTMENT | CUTANEOUS | 0 refills | Status: AC | PRN

## 2019-09-25 NOTE — ED Provider Notes (Signed)
[facility]    CSN: [id number] Arrival date & time: 09/25/19  1814      History   Chief Complaint Chief Complaint  Patient presents with  . Vaginitis    HPI Kristen Casey is a 39 y.o. female comes to urgent care with complaints of vaginal burning cramping pain for few days duration.  Patient was engaged in protected sexual intercourse recently and is not sure if this is related to sexual encounter.  Last sexual encounter was about 2 weeks ago.  She denies any dysuria, urgency or frequency.  No rash near vaginal area.  Patient shaves regularly.  No abdominal pain.  Patient would like to be evaluated and have STD screening done.   HPI  History reviewed. No pertinent past medical history.  Patient Active Problem List   Diagnosis Date Noted  . Hx of ectopic pregnancy 10/17/2012    History reviewed. No pertinent surgical history.  OB History    Gravida  4   Para  2   Term  1   Preterm  1   AB  1   Living  1     SAB      TAB      Ectopic  1   Multiple      Live Births  2            Home Medications    Prior to Admission medications   Medication Sig Start Date End Date Taking? Authorizing Provider  acetaminophen (TYLENOL) 500 MG tablet Take 500 mg by mouth every 6 (six) hours as needed. Pain    [provider]  diphenhydrAMINE-zinc acetate (BENADRYL) cream Apply 1 application topically 3 (three) times daily as needed for itching.    [provider]  ferrous sulfate 325 (65 FE) MG tablet Take 325 mg by mouth every other day. 07/12/19   [provider]  ibuprofen (ADVIL,MOTRIN) 800 MG tablet Take 1 tablet (800 mg total) by mouth every 8 (eight) hours as needed. 02/15/17   Tilden Fossa, MD  JUNEL FE 1.5/30 1.5-30 MG-MCG tablet Take 1 tablet by mouth daily. 07/12/19   [provider]  lidocaine (XYLOCAINE) 5 % ointment Apply 1 application topically as needed. 09/25/19   Borna Wessinger, Britta Mccreedy, MD    Family  History No family history on file.  Social History Social History   Tobacco Use  . Smoking status: Former Smoker    Quit date: 08/07/2016    Years since quitting: 3.1  . Smokeless tobacco: Never Used  Substance Use Topics  . Alcohol use: Yes    Comment: occ  . Drug use: No     Allergies   Patient has no known allergies.   Review of Systems Review of Systems  Genitourinary: Positive for vaginal pain. Negative for dysuria, frequency, urgency, vaginal bleeding and vaginal discharge.  Neurological: Negative.      Physical Exam Triage Vital Signs ED Triage Vitals  Enc Vitals Group     BP 09/25/19 1946 (!) 151/86     Pulse Rate 09/25/19 1946 88     Resp 09/25/19 1946 16     Temp 09/25/19 1946 98.6 F (37 C)     Temp Source 09/25/19 1946 Oral     SpO2 09/25/19 1946 100 %     Weight 09/25/19 1949 189 lb (85.7 kg)     Height 09/25/19 1949 5\' 4"  (1.626 m)     Head Circumference --      Peak  Flow --      Pain Score 09/25/19 1949 6     Pain Loc --      Pain Edu? --      Excl. in GC? --    No data found.  Updated Vital Signs BP (!) 151/86   Pulse 88   Temp 98.6 F (37 C) (Oral)   Resp 16   Ht 5\' 4"  (1.626 m)   Wt 85.7 kg   SpO2 100%   BMI 32.44 kg/m   Visual Acuity Right Eye Distance:   Left Eye Distance:   Bilateral Distance:    Right Eye Near:   Left Eye Near:    Bilateral Near:     Physical Exam Abdominal:     General: Bowel sounds are normal. There is no distension.     Palpations: Abdomen is soft.     Tenderness: There is no abdominal tenderness. There is no rebound.  Genitourinary:    General: Normal vulva.     Vagina: Vaginal discharge present.     Rectum: Normal. Guaiac result negative.      UC Treatments / Results  Labs (all labs ordered are listed, but only abnormal results are displayed) Labs Reviewed  CERVICOVAGINAL ANCILLARY ONLY - Abnormal; Notable for the following components:      Result Value   Bacterial Vaginitis  (gardnerella) Positive (*)    All other components within normal limits    EKG   Radiology No results found.  Procedures Procedures (including critical care time)  Medications Ordered in UC Medications - No data to display  Initial Impression / Assessment and Plan / UC Course  I have reviewed the triage vital signs and the nursing notes.  Pertinent labs & imaging results that were available during my care of the patient were reviewed by me and considered in my medical decision making (see chart for details).     1.  Acute vaginitis: Cervical vaginal swab for GC/chlamydia/yeast/trichomonas If cervicovaginal swab is positive we will call the patient and send appropriate medications Lidocaine ointment to apply to the area involved Return precautions given Final Clinical Impressions(s) / UC Diagnoses   Final diagnoses:  Vaginitis and vulvovaginitis   Discharge Instructions   None    ED Prescriptions    Medication Sig Dispense Auth. Provider   lidocaine (XYLOCAINE) 5 % ointment Apply 1 application topically as needed. 35.44 g , MD     PDMP not reviewed this encounter.   Merrilee Jansky, MD 09/26/19 2136
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
## 2019-09-26 LAB — CERVICOVAGINAL ANCILLARY ONLY
Bacterial Vaginitis (gardnerella): POSITIVE — AB
Candida Glabrata: NEGATIVE
Candida Vaginitis: NEGATIVE
Chlamydia: NEGATIVE
Comment: NEGATIVE
Comment: NEGATIVE
Comment: NEGATIVE
Comment: NEGATIVE
Comment: NEGATIVE
Comment: NORMAL
Neisseria Gonorrhea: NEGATIVE
Trichomonas: NEGATIVE

## 2019-09-27 ENCOUNTER — Telehealth (HOSPITAL_COMMUNITY): Payer: Self-pay | Admitting: Family Medicine

## 2019-09-27 MED ORDER — METRONIDAZOLE 500 MG PO TABS: 500.0000 mg | ORAL_TABLET | Freq: Two times a day (BID) | ORAL | 0 refills | Status: AC

## 2019-09-27 NOTE — Telephone Encounter (Signed)
Treating BV.  Meds ordered this encounter  Medications  . metroNIDAZOLE (FLAGYL) 500 MG tablet    Sig: Take 1 tablet (500 mg total) by mouth 2 (two) times daily.    Dispense:  14 tablet    Refill:  0

## 2019-11-08 ENCOUNTER — Other Ambulatory Visit: Payer: Self-pay | Admitting: Physician Assistant

## 2019-11-08 DIAGNOSIS — N920 Excessive and frequent menstruation with regular cycle: Secondary | ICD-10-CM

## 2019-11-24 ENCOUNTER — Ambulatory Visit
Admission: RE | Admit: 2019-11-24 | Discharge: 2019-11-24 | Disposition: A | Discharge status: Home / Self Care | Payer: 59 | Source: Ambulatory Visit | Attending: Physician Assistant | Admitting: Physician Assistant

## 2019-11-24 DIAGNOSIS — N920 Excessive and frequent menstruation with regular cycle: Secondary | ICD-10-CM

## 2020-03-11 ENCOUNTER — Encounter (HOSPITAL_COMMUNITY): Payer: Self-pay | Admitting: *Deleted

## 2020-03-11 ENCOUNTER — Ambulatory Visit (HOSPITAL_COMMUNITY)
Admission: EM | Admit: 2020-03-11 | Discharge: 2020-03-11 | Disposition: A | Discharge status: Home / Self Care | Payer: 59 | Attending: Family Medicine | Admitting: Family Medicine

## 2020-03-11 ENCOUNTER — Other Ambulatory Visit: Payer: Self-pay

## 2020-03-11 DIAGNOSIS — L02214 Cutaneous abscess of groin: Secondary | ICD-10-CM

## 2020-03-11 MED ORDER — HIBICLENS 4 % EX LIQD: Freq: Every day | CUTANEOUS | 0 refills | Status: AC | PRN

## 2020-03-11 MED ORDER — SULFAMETHOXAZOLE-TRIMETHOPRIM 800-160 MG PO TABS: 1.0000 | ORAL_TABLET | Freq: Two times a day (BID) | ORAL | 0 refills | Status: AC

## 2020-03-11 NOTE — ED Triage Notes (Signed)
Pt reports she has a abscess on Lt upper inner thigh after shaving area.

## 2020-03-11 NOTE — ED Provider Notes (Signed)
[facility]    CSN: [id number] Arrival date & time: 03/11/20  1848      History   Chief Complaint Chief Complaint  Patient presents with  . Abscess    Upper inner thigh    HPI Kristen Casey is a 40 y.o. female.   Patient presenting today with a swollen painful area left groin/upper thigh worsening the past 2 days.  She states she shaved the area and then went to the gym, noted a hair bump after this that just continued to get worse.  She clean the area well and use Neosporin to the area with no benefit.  Denies drainage, fever, chills, sweats, body aches.  Has had this happen before after shaving.     History reviewed. No pertinent past medical history.  Patient Active Problem List   Diagnosis Date Noted  . Hx of ectopic pregnancy 10/17/2012    History reviewed. No pertinent surgical history.  OB History    Gravida  4   Para  2   Term  1   Preterm  1   AB  1   Living  1     SAB      IAB      Ectopic  1   Multiple      Live Births  2            Home Medications    Prior to Admission medications   Medication Sig Start Date End Date Taking? Authorizing Provider  chlorhexidine (HIBICLENS) 4 % external liquid Apply topically daily as needed. 03/11/20  Yes Particia Nearing, PA-C  sulfamethoxazole-trimethoprim (BACTRIM DS) 800-160 MG tablet Take 1 tablet by mouth 2 (two) times daily for 7 days. 03/11/20 03/18/20 Yes Particia Nearing, PA-C  acetaminophen (TYLENOL) 500 MG tablet Take 500 mg by mouth every 6 (six) hours as needed. Pain    [provider]  diphenhydrAMINE-zinc acetate (BENADRYL) cream Apply 1 application topically 3 (three) times daily as needed for itching.    [provider]  ferrous sulfate 325 (65 FE) MG tablet Take 325 mg by mouth every other day. 07/12/19   [provider]  ibuprofen (ADVIL,MOTRIN) 800 MG tablet Take 1 tablet (800 mg total) by mouth every 8 (eight) hours as needed. 02/15/17    Tilden Fossa, MD  JUNEL FE 1.5/30 1.5-30 MG-MCG tablet Take 1 tablet by mouth daily. 07/12/19   [provider]  lidocaine (XYLOCAINE) 5 % ointment Apply 1 application topically as needed. 09/25/19   Lamptey, Britta Mccreedy, MD  metroNIDAZOLE (FLAGYL) 500 MG tablet Take 1 tablet (500 mg total) by mouth 2 (two) times daily. 09/27/19   Mardella Layman, MD    Family History History reviewed. No pertinent family history.  Social History Social History   Tobacco Use  . Smoking status: Former Smoker    Quit date: 08/07/2016    Years since quitting: 3.5  . Smokeless tobacco: Never Used  Substance Use Topics  . Alcohol use: Yes    Comment: occ  . Drug use: No     Allergies   Other   Review of Systems Review of Systems Per HPI Physical Exam Triage Vital Signs ED Triage Vitals  Enc Vitals Group     BP 03/11/20 2008 (!) 139/99     Pulse Rate 03/11/20 2008 99     Resp 03/11/20 2008 20     Temp 03/11/20 2008 98 F (36.7 C)     Temp Source 03/11/20  2008 Temporal     SpO2 03/11/20 2008 100 %     Weight --      Height --      Head Circumference --      Peak Flow --      Pain Score 03/11/20 2004 8     Pain Loc --      Pain Edu? --      Excl. in GC? --    No data found.  Updated Vital Signs BP (!) 139/99 (BP Location: Right Arm)   Pulse 99   Temp 98 F (36.7 C) (Temporal)   Resp 20   LMP 02/22/2020   SpO2 100%   Visual Acuity Right Eye Distance:   Left Eye Distance:   Bilateral Distance:    Right Eye Near:   Left Eye Near:    Bilateral Near:     Physical Exam Vitals and nursing note reviewed.  Constitutional:      Appearance: Normal appearance. She is not ill-appearing.  HENT:     Head: Atraumatic.  Eyes:     Extraocular Movements: Extraocular movements intact.     Conjunctiva/sclera: Conjunctivae normal.  Cardiovascular:     Rate and Rhythm: Normal rate and regular rhythm.     Heart sounds: Normal heart sounds.  Pulmonary:     Effort: Pulmonary effort  is normal.     Breath sounds: Normal breath sounds.  Musculoskeletal:        General: Normal range of motion.     Cervical back: Normal range of motion and neck supple.  Skin:    General: Skin is warm and dry.     Findings: Erythema present.     Comments: 1 cm firm small erythematous boil left groin fold at base of upper thigh, tender to palpation.  No active drainage from the area.  Neurological:     Mental Status: She is alert and oriented to person, place, and time.  Psychiatric:        Mood and Affect: Mood normal.        Thought Content: Thought content normal.        Judgment: Judgment normal.      UC Treatments / Results  Labs (all labs ordered are listed, but only abnormal results are displayed) Labs Reviewed - No data to display  EKG   Radiology No results found.  Procedures Procedures (including critical care time)  Medications Ordered in UC Medications - No data to display  Initial Impression / Assessment and Plan / UC Course  I have reviewed the triage vital signs and the nursing notes.  Pertinent labs & imaging results that were available during my care of the patient were reviewed by me and considered in my medical decision making (see chart for details).     No indication for I&D today as the area is firm.  Will start Bactrim, Hibiclens, warm compresses, Neosporin and monitor closely for improvement.  Use the Hibiclens several times weekly particularly after shaving for future prevention of this issue.  Return for acutely worsening symptoms.  Final Clinical Impressions(s) / UC Diagnoses   Final diagnoses:  Abscess of left groin   Discharge Instructions   None    ED Prescriptions    Medication Sig Dispense Auth. Provider   sulfamethoxazole-trimethoprim (BACTRIM DS) 800-160 MG tablet Take 1 tablet by mouth 2 (two) times daily for 7 days. 14 tablet Particia Nearing, [place]   chlorhexidine (HIBICLENS) 4 % external liquid Apply topically daily as  needed. 120 mL Particia Nearing, [place]     PDMP not reviewed this encounter.   Particia Nearing, [place] 03/11/20 2026

## 2020-05-13 ENCOUNTER — Other Ambulatory Visit: Payer: Self-pay | Admitting: Urgent Care

## 2020-05-13 DIAGNOSIS — N8000 Endometriosis of the uterus, unspecified: Secondary | ICD-10-CM

## 2020-05-13 DIAGNOSIS — Z1231 Encounter for screening mammogram for malignant neoplasm of breast: Secondary | ICD-10-CM

## 2021-03-07 ENCOUNTER — Emergency Department (HOSPITAL_COMMUNITY)
Admission: EM | Admit: 2021-03-07 | Discharge: 2021-03-07 | Discharge status: Against Medical Advice | Payer: BC Managed Care – PPO | Attending: Emergency Medicine | Admitting: Emergency Medicine

## 2021-03-07 ENCOUNTER — Encounter (HOSPITAL_COMMUNITY): Payer: Self-pay | Admitting: *Deleted

## 2021-03-07 ENCOUNTER — Other Ambulatory Visit: Payer: Self-pay

## 2021-03-07 DIAGNOSIS — Z5321 Procedure and treatment not carried out due to patient leaving prior to being seen by health care provider: Secondary | ICD-10-CM | POA: Diagnosis not present

## 2021-03-07 DIAGNOSIS — M545 Low back pain, unspecified: Secondary | ICD-10-CM | POA: Insufficient documentation

## 2021-03-07 DIAGNOSIS — Y92410 Unspecified street and highway as the place of occurrence of the external cause: Secondary | ICD-10-CM | POA: Diagnosis not present

## 2021-03-07 NOTE — ED Notes (Signed)
Called x3 for room, no answer. 
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
## 2022-02-17 IMAGING — US US PELVIS COMPLETE WITH TRANSVAGINAL
1 series · 13 of 25 positions shown · non-contrast
Comparison: Prior ultrasound from 10/28/2011

CLINICAL DATA: Initial evaluation for excessive infrequent
menstruation with regular cycle.



[Series 1: us pelvis complete with transvaginal · 0.26mm/px · 13 of 54 slices shown]
[im 1/54]
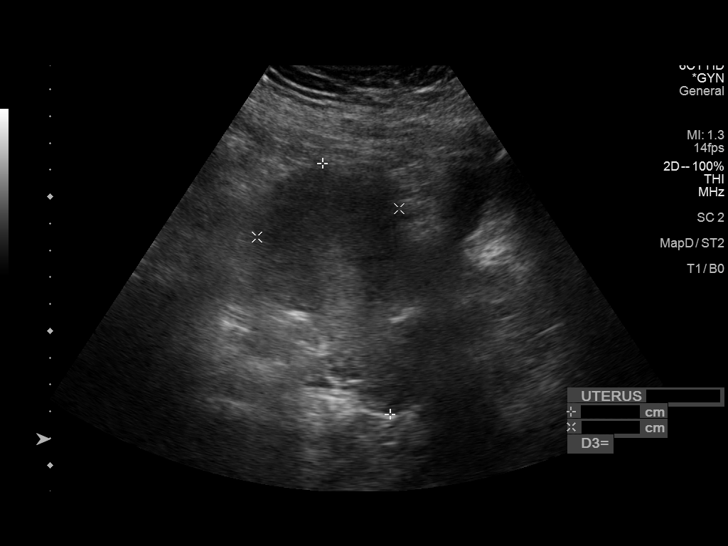
[im 5/54]
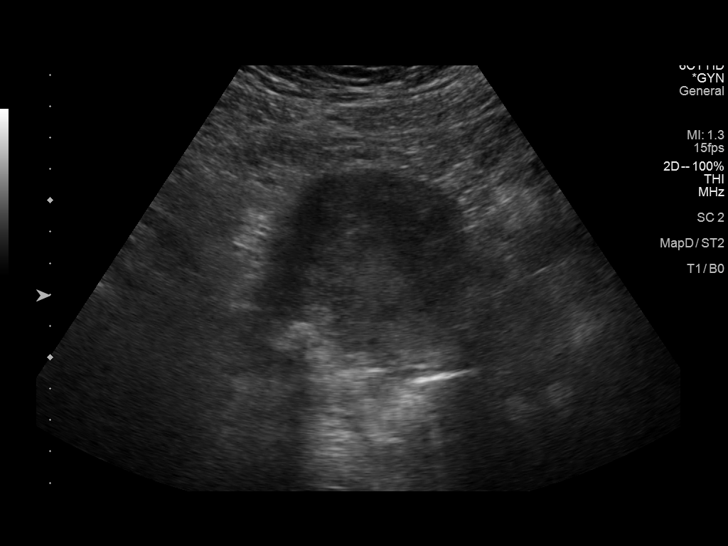
[im 9/54]
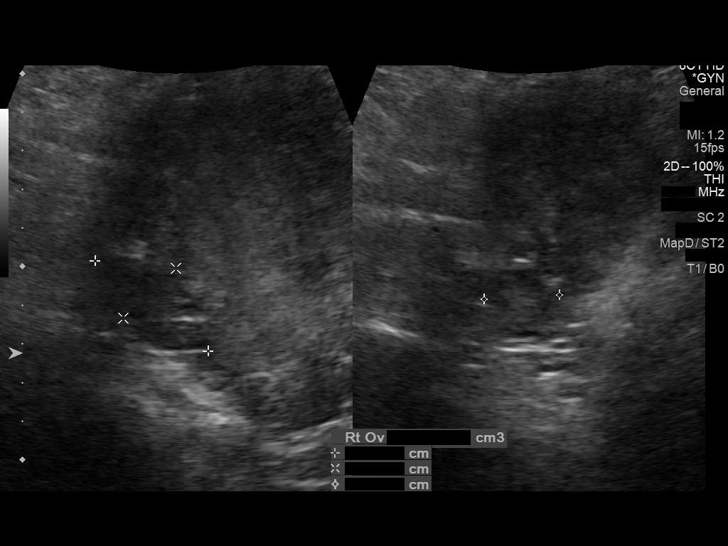
[im 14/54]
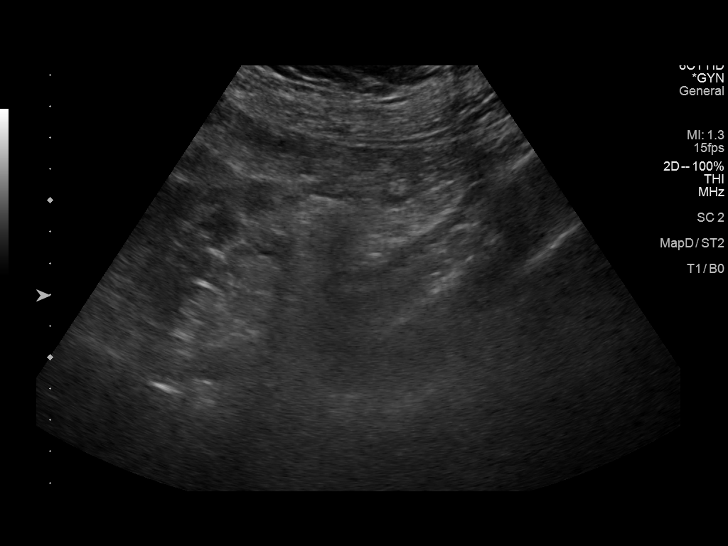
[im 18/54]
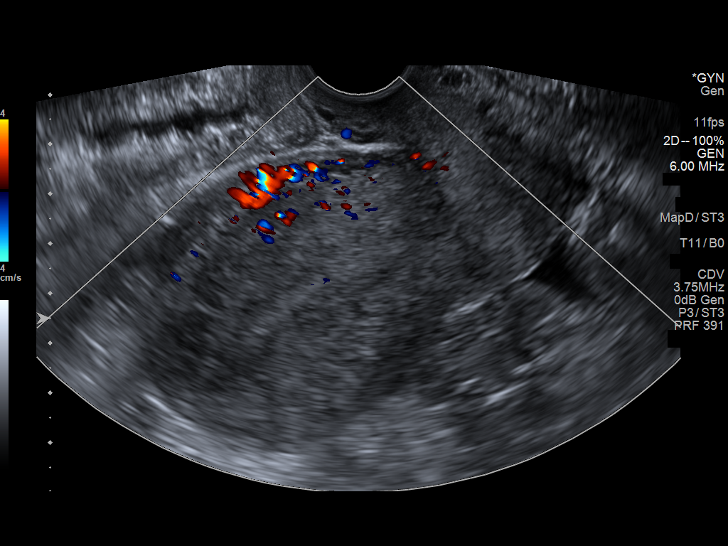
[im 23/54]
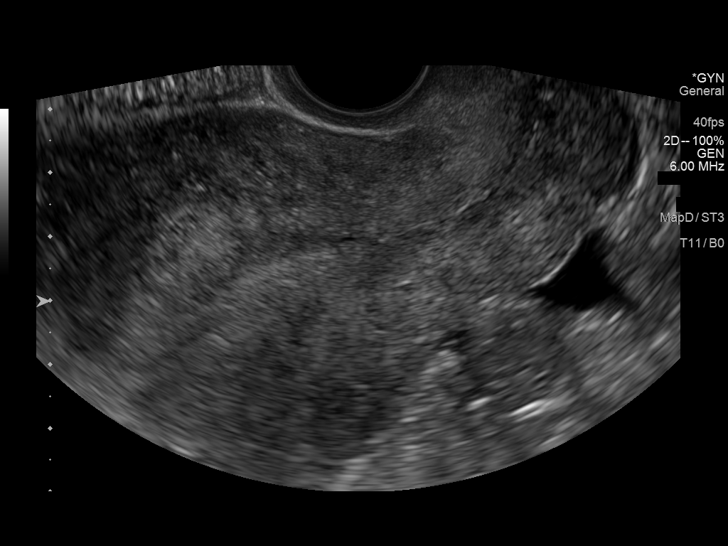
[im 27/54]
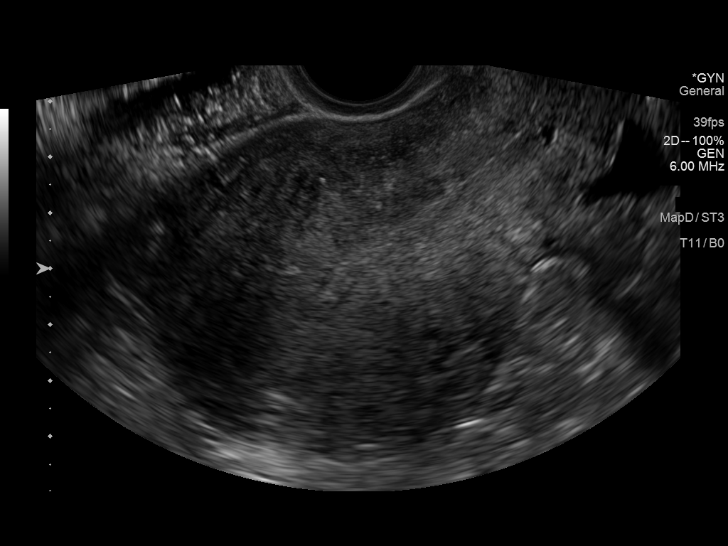
[im 31/54]
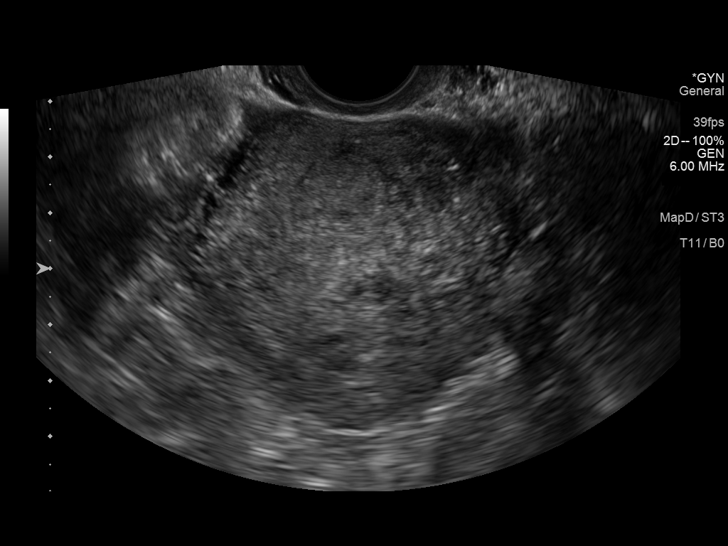
[im 36/54]
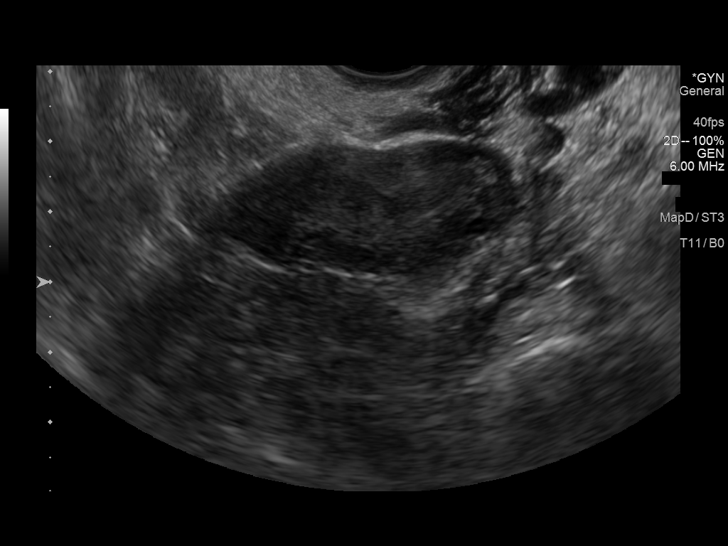
[im 40/54]
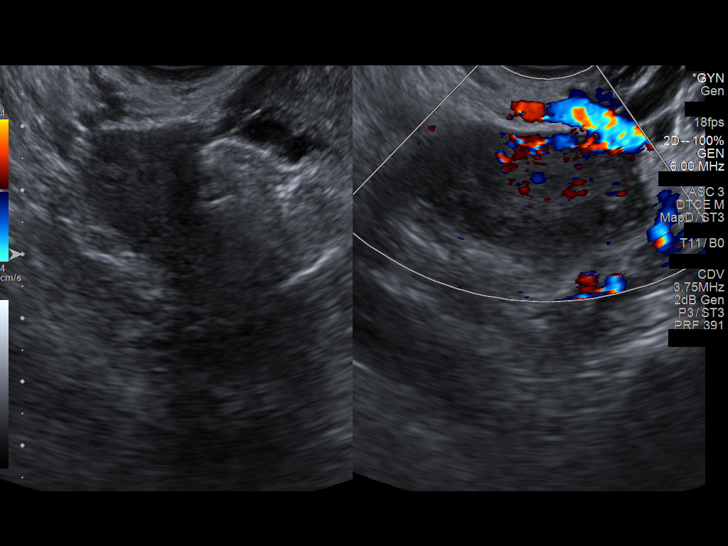
[im 45/54]
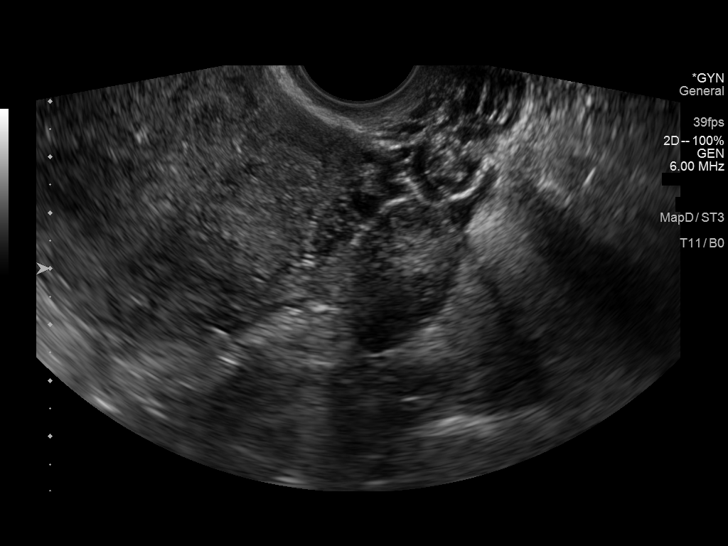
[im 49/54]
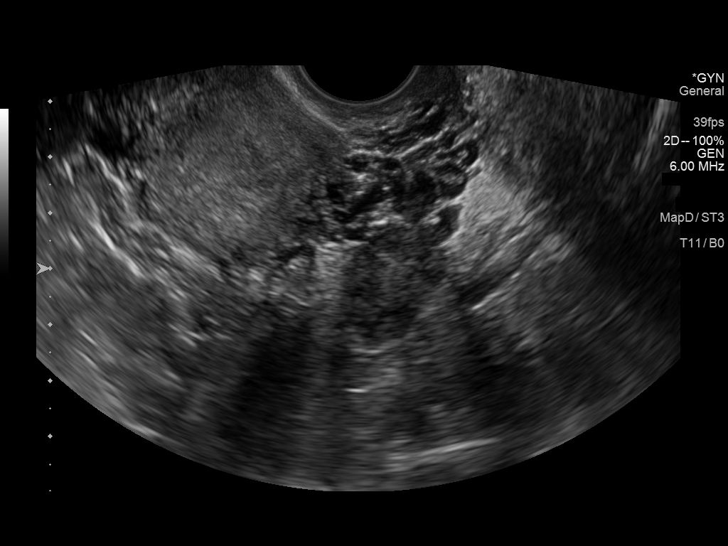
[im 54/54]
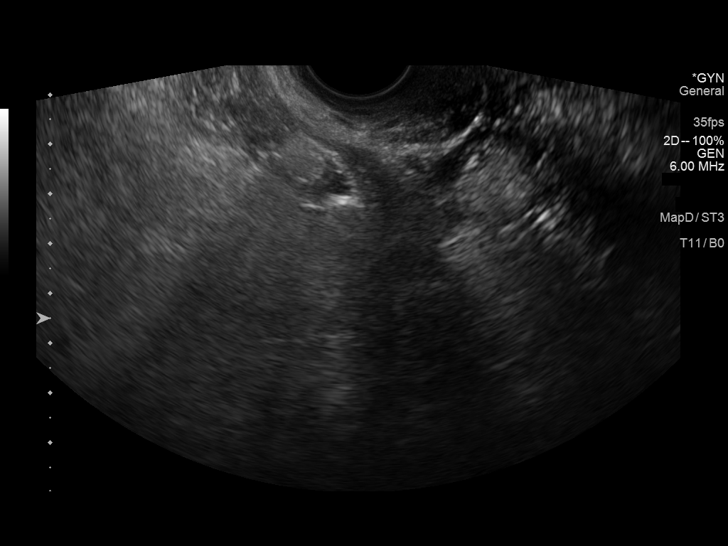

[13 of 25 positions shown; findings below may reference images not displayed]

FINDINGS: Uterus

Measurements: 9.7 x 5.4 x 7.2 cm = volume: 200 mL. Uterus is
anteverted and demonstrates a somewhat globular contour.
Heterogeneous echotexture within the uterine myometrium with a few
scattered vertical echogenic striations (image 30).

Endometrium

Thickness: 4.5 mm. Somewhat poor definition of the
endometrial/myometrial interface. No focal abnormality.

Right ovary

Measurements: 2.0 x 2.0 x 4.0 cm = volume: 8.0 mL. Normal
appearance/no adnexal mass.

Left ovary

Measurements: 2.9 x 1.9 x 3.4 cm = volume: 10.0 mL. Normal
appearance/no adnexal mass.

Other findings

Trace free fluid within the pelvis, presumably physiologic.
IMPRESSION: 1. Heterogeneous echotexture of the uterine myometrium with a few
scattered echogenic striations and ill-defined
endometrial-myometrial interface. Findings can be seen the setting
of adenomyosis.
2. Endometrial stripe itself measures 4.5 mm in thickness. If
bleeding remains unresponsive to hormonal or medical therapy,
sonohysterogram should be considered for focal lesion work-up. (Ref:
Radiological Reasoning: Algorithmic Workup of Abnormal Vaginal
Bleeding with Endovaginal Sonography and Sonohysterography. AJR
2997; 191:S68-73).
3. Otherwise unremarkable and normal pelvic ultrasound.

## 2023-04-28 ENCOUNTER — Other Ambulatory Visit: Payer: Self-pay

## 2023-04-28 ENCOUNTER — Encounter (HOSPITAL_COMMUNITY): Payer: Self-pay | Admitting: *Deleted

## 2023-04-28 ENCOUNTER — Emergency Department (HOSPITAL_COMMUNITY)
Admission: EM | Admit: 2023-04-28 | Discharge: 2023-04-29 | Disposition: A | Discharge status: Home / Self Care | Payer: Self-pay | Attending: Emergency Medicine | Admitting: Emergency Medicine

## 2023-04-28 DIAGNOSIS — R300 Dysuria: Secondary | ICD-10-CM | POA: Insufficient documentation

## 2023-04-28 DIAGNOSIS — D509 Iron deficiency anemia, unspecified: Secondary | ICD-10-CM | POA: Insufficient documentation

## 2023-04-28 HISTORY — DX: Unspecified asthma, uncomplicated: J45.909

## 2023-04-28 LAB — URINALYSIS, ROUTINE W REFLEX MICROSCOPIC
Bilirubin Urine: NEGATIVE
Glucose, UA: NEGATIVE mg/dL
Hgb urine dipstick: NEGATIVE
Ketones, ur: NEGATIVE mg/dL
Nitrite: NEGATIVE
Protein, ur: NEGATIVE mg/dL
Specific Gravity, Urine: 1.015 (ref 1.005–1.030)
pH: 5 (ref 5.0–8.0)

## 2023-04-28 LAB — CBC
HCT: 31.2 % — ABNORMAL LOW (ref 36.0–46.0)
Hemoglobin: 8.6 g/dL — ABNORMAL LOW (ref 12.0–15.0)
MCH: 19.8 pg — ABNORMAL LOW (ref 26.0–34.0)
MCHC: 27.6 g/dL — ABNORMAL LOW (ref 30.0–36.0)
MCV: 71.7 fL — ABNORMAL LOW (ref 80.0–100.0)
Platelets: 433 10*3/uL — ABNORMAL HIGH (ref 150–400)
RBC: 4.35 MIL/uL (ref 3.87–5.11)
RDW: 19.1 % — ABNORMAL HIGH (ref 11.5–15.5)
WBC: 7 10*3/uL (ref 4.0–10.5)
nRBC: 0 % (ref 0.0–0.2)

## 2023-04-28 NOTE — ED Provider Triage Note (Signed)
 Emergency Medicine Provider Triage Evaluation Note  Kristen Casey , a 43 y.o. female  was evaluated in triage.  Pt complains of dysuria.  She is concerned about UTI.  She reports that she has had some vaginal discharge, but states that might be due to her coming on her cycle.  She states that she feels constipated and had to strain and thinks she irritated something "back there."  Review of Systems  Positive: dysuria Negative: fever  Physical Exam  BP (!) 142/84 (BP Location: Right Arm)   Pulse 83   Temp 98.2 F (36.8 C)   Resp 16   Ht 5\' 4"  (1.626 m)   Wt 85.7 kg   LMP 03/07/2023   SpO2 99%   BMI 32.43 kg/m  Gen:   Awake, no distress   Resp:  Normal effort  MSK:   Moves extremities without difficulty  Other:    Medical Decision Making  Medically screening exam initiated at 11:33 PM.  Appropriate orders placed.  Sherida Dimmer was informed that the remainder of the evaluation will be completed by another provider, this initial triage assessment does not replace that evaluation, and the importance of remaining in the ED until their evaluation is complete.     Sherel Dikes, PA-C 04/28/23 2334

## 2023-04-28 NOTE — ED Triage Notes (Signed)
 The pt has had urinary frequency since last pm no blood seen in her urine  no temp  lmp feb 1st

## 2023-04-29 LAB — COMPREHENSIVE METABOLIC PANEL WITH GFR
ALT: 17 U/L (ref 0–44)
AST: 19 U/L (ref 15–41)
Albumin: 3.7 g/dL (ref 3.5–5.0)
Alkaline Phosphatase: 68 U/L (ref 38–126)
Anion gap: 9 (ref 5–15)
BUN: 13 mg/dL (ref 6–20)
CO2: 19 mmol/L — ABNORMAL LOW (ref 22–32)
Calcium: 9.3 mg/dL (ref 8.9–10.3)
Chloride: 111 mmol/L (ref 98–111)
Creatinine, Ser: 0.85 mg/dL (ref 0.44–1.00)
GFR, Estimated: >60 mL/min (ref >60)
Glucose, Bld: 105 mg/dL — ABNORMAL HIGH (ref 70–99)
Potassium: 4 mmol/L (ref 3.5–5.1)
Sodium: 139 mmol/L (ref 135–145)
Total Bilirubin: 0.4 mg/dL (ref 0.0–1.2)
Total Protein: 6.6 g/dL (ref 6.5–8.1)

## 2023-04-29 LAB — HCG, SERUM, QUALITATIVE: Preg, Serum: NEGATIVE

## 2023-04-29 LAB — LIPASE, BLOOD: Lipase: 50 U/L (ref 11–51)

## 2023-04-29 MED ORDER — FERROUS SULFATE 325 (65 FE) MG PO TABS: 325.0000 mg | ORAL_TABLET | Freq: Every day | ORAL | 0 refills | Status: AC

## 2023-04-29 NOTE — ED Provider Notes (Signed)
 [place] EMERGENCY DEPARTMENT AT [facility] Provider Note   CSN: [id number] Arrival date & time: 04/28/23  2248     History  Chief Complaint  Patient presents with   poss uti    Kristen Casey is a 43 y.o. female.  The history is provided by the patient and medical records. No language interpreter was used.     43 year old female history of iron deficiency anemia presenting with multiple complaints.  Patient states for the past week she has not been feeling well.  She endorsed noticing urinary discomfort with increased frequency and urgency, some vaginal irritation without any strong odor.  Several days ago she was constipated and had a bowel movement that caused some rectal tear and she was having some small amount of bleeding.  That has since resolved.  She endorsed having bilateral ankle swelling, feeling tired.  She is sexually active with the same partner and does use protection.  She denies any chest pain or shortness of breath.  She endorsed feeling hungry after 15 hours wait in the ER requesting to eat.  She mention she was on iron supplementation but has to change to over-the-counter iron supplementation because the previous medication is difficult to tolerate.  Home Medications Prior to Admission medications   Medication Sig Start Date End Date Taking? Authorizing Provider  acetaminophen  (TYLENOL ) 500 MG tablet Take 500 mg by mouth every 6 (six) hours as needed. Pain    [provider]  chlorhexidine (HIBICLENS ) 4 % external liquid Apply topically daily as needed. 03/11/20   Corbin Dess, PA-C  diphenhydrAMINE-zinc acetate (BENADRYL) cream Apply 1 application topically 3 (three) times daily as needed for itching.    [provider]  ferrous sulfate  325 (65 FE) MG tablet Take 325 mg by mouth every other day. 07/12/19   [provider]  ibuprofen  (ADVIL ,MOTRIN ) 800 MG tablet Take 1 tablet (800 mg total) by mouth every 8 (eight) hours  as needed. 02/15/17   Kelsey Patricia, MD  JUNEL FE 1.5/30 1.5-30 MG-MCG tablet Take 1 tablet by mouth daily. 07/12/19   [provider]  lidocaine  (XYLOCAINE ) 5 % ointment Apply 1 application topically as needed. 09/25/19   Lamptey, Donley Furth, MD  metroNIDAZOLE  (FLAGYL ) 500 MG tablet Take 1 tablet (500 mg total) by mouth 2 (two) times daily. 09/27/19   Afton Albright, MD      Allergies    Other    Review of Systems   Review of Systems  All other systems reviewed and are negative.   Physical Exam Updated Vital Signs BP (!) 155/80 (BP Location: Left Arm)   Pulse 67   Temp (!) 97.4 F (36.3 C)   Resp 16   Ht 5\' 4"  (1.626 m)   Wt 85.7 kg   LMP 03/07/2023   SpO2 100%   BMI 32.43 kg/m  Physical Exam Vitals and nursing note reviewed.  Constitutional:      General: She is not in acute distress.    Appearance: She is well-developed.  HENT:     Head: Atraumatic.  Eyes:     Conjunctiva/sclera: Conjunctivae normal.  Cardiovascular:     Rate and Rhythm: Normal rate and regular rhythm.     Pulses: Normal pulses.     Heart sounds: Normal heart sounds.  Pulmonary:     Effort: Pulmonary effort is normal.  Abdominal:     Palpations: Abdomen is soft.     Tenderness: There is no abdominal tenderness.  Musculoskeletal:  General: Normal range of motion.     Cervical back: Neck supple.  Skin:    General: Skin is warm.     Findings: No rash.  Neurological:     Mental Status: She is alert. Mental status is at baseline.  Psychiatric:        Mood and Affect: Mood normal.     ED Results / Procedures / Treatments   Labs (all labs ordered are listed, but only abnormal results are displayed) Labs Reviewed  COMPREHENSIVE METABOLIC PANEL WITH GFR - Abnormal; Notable for the following components:      Result Value   CO2 19 (*)    Glucose, Bld 105 (*)    All other components within normal limits  CBC - Abnormal; Notable for the following components:   Hemoglobin 8.6 (*)     HCT 31.2 (*)    MCV 71.7 (*)    MCH 19.8 (*)    MCHC 27.6 (*)    RDW 19.1 (*)    Platelets 433 (*)    All other components within normal limits  URINALYSIS, ROUTINE W REFLEX MICROSCOPIC - Abnormal; Notable for the following components:   Leukocytes,Ua SMALL (*)    Bacteria, UA RARE (*)    All other components within normal limits  LIPASE, BLOOD  HCG, SERUM, QUALITATIVE    EKG None  Radiology No results found.  Procedures Procedures    Medications Ordered in ED Medications - No data to display  ED Course/ Medical Decision Making/ A&P                                 Medical Decision Making  BP (!) 155/80 (BP Location: Left Arm)   Pulse 67   Temp (!) 97.4 F (36.3 C)   Resp 16   Ht 5\' 4"  (1.626 m)   Wt 85.7 kg   LMP 03/07/2023   SpO2 100%   BMI 32.43 kg/m   51:73 PM 43 year old female history of iron deficiency anemia presenting with multiple complaints.  Patient states for the past week she has not been feeling well.  She endorsed noticing urinary discomfort with increased frequency and urgency, some vaginal irritation without any strong odor.  Several days ago she was constipated and had a bowel movement that caused some rectal tear and she was having some small amount of bleeding.  That has since resolved.  She endorsed having bilateral ankle swelling, feeling tired.  She is sexually active with the same partner and does use protection.  She denies any chest pain or shortness of breath.  She endorsed feeling hungry after 15 hours wait in the ER requesting to eat.  She mention she was on iron supplementation but has to change to over-the-counter iron supplementation because the previous medication is difficult to tolerate.  On exam patient is resting comfortably appears to be in no acute discomfort.  No reproducible tenderness noted.  -Labs ordered, independently viewed and interpreted by me.  Labs remarkable for hemoglobin of 8.6.  EMR review patient has a normal  hemoglobin of 13.2 in 2023.  She does have a history of iron deficiency and was taking some over-the-counter iron supplementation.  She did endorse having 1 episode of rectal bleeding from constipation a few days prior and she also admits to having heavy menstruation last menstruation was a month ago.  Menstruation usually lasting for about 7 days.  Urine today did not show  obvious signs of urinary tract infection.  Her pregnancy test is negative.  I did encourage patient to stay in the hospital to be evaluated by another provider as patient may benefit from a pelvic exam to assess and rule out STI but patient states after waiting for 15 hours she prefers to go home and will follow-up outpatient for further care. -The patient was maintained on a cardiac monitor.  I personally viewed and interpreted the cardiac monitored which showed an underlying rhythm of: Normal sinus rhythm -Imaging including transvaginal US  considered -This patient presents to the ED for concern of multiple complaints, this involves an extensive number of treatment options, and is a complaint that carries with it a high risk of complications and morbidity.  The differential diagnosis includes UTI, STI, iron deficiency anemia, rectal bleeding, upper GI bleed, lower GI bleed, heart failure, pregnancy, anemia -Co morbidities that complicate the patient evaluation includes iron deficiency -Treatment includes observation -Reevaluation of the patient after these medicines showed that the patient stayed the same -PCP office notes or outside notes reviewed -Escalation to admission/observation considered: patients feels much better, is comfortable with discharge, and will follow up with PCP -Prescription medication considered, patient comfortable with iron supplementation -Social Determinant of Health considered which includes tobacco use hx         Final Clinical Impression(s) / ED Diagnoses Final diagnoses:  Dysuria  Microcytic  anemia    Rx / DC Orders ED Discharge Orders          Ordered    ferrous sulfate  325 (65 FE) MG tablet  Daily with breakfast        04/29/23 1429              Debbra Fairy, PA-C 04/29/23 1430    Mozell Arias, MD 04/29/23 1607

## 2023-04-29 NOTE — Discharge Instructions (Signed)
 You have been evaluated for your urinary discomfort.  Unfortunately we have not fully evaluate your condition therefore please follow-up closely with your primary care doctor for reassessment as you may benefit from a pelvic examination to rule out other type of infection that can cause urinary discomfort.  Your urine today did not show signs of a urinary tract infection.  Your hemoglobin is low at 8.6.  This is likely iron deficiency anemia.  Please take iron supplementation as prescribed however follow-up closely with your doctor for further evaluation.  Return if you have other concern.
